# Patient Record
Sex: Male | Born: 1965 | Race: White | Hispanic: No | Marital: Single | Smoking: Current every day smoker
Health system: Southern US, Community
[De-identification: ages and names within clinical notes are randomized; demographics above are authoritative.]

## PROBLEM LIST (undated history)

## (undated) DIAGNOSIS — I10 Essential (primary) hypertension: Secondary | ICD-10-CM

## (undated) DIAGNOSIS — E785 Hyperlipidemia, unspecified: Secondary | ICD-10-CM

## (undated) DIAGNOSIS — J45909 Unspecified asthma, uncomplicated: Secondary | ICD-10-CM

## (undated) DIAGNOSIS — E079 Disorder of thyroid, unspecified: Secondary | ICD-10-CM

## (undated) DIAGNOSIS — E119 Type 2 diabetes mellitus without complications: Secondary | ICD-10-CM

## (undated) HISTORY — PX: CARPAL TUNNEL RELEASE: SHX101

## (undated) HISTORY — PX: OTHER SURGICAL HISTORY: SHX169

## (undated) HISTORY — DX: Hyperlipidemia, unspecified: E78.5

---

## 2019-02-21 ENCOUNTER — Emergency Department: Payer: PRIVATE HEALTH INSURANCE

## 2019-02-21 ENCOUNTER — Observation Stay
Admission: EM | Admit: 2019-02-21 | Discharge: 2019-02-23 | Disposition: A | Discharge status: Home / Self Care | Payer: PRIVATE HEALTH INSURANCE | Attending: Surgery | Admitting: Surgery

## 2019-02-21 ENCOUNTER — Encounter: Payer: Self-pay | Admitting: Emergency Medicine

## 2019-02-21 ENCOUNTER — Emergency Department: Payer: PRIVATE HEALTH INSURANCE | Admitting: Certified Registered"

## 2019-02-21 ENCOUNTER — Encounter
Admission: EM | Disposition: A | Discharge status: Home / Self Care | Payer: Self-pay | Source: Home / Self Care | Attending: Emergency Medicine

## 2019-02-21 ENCOUNTER — Other Ambulatory Visit: Payer: Self-pay

## 2019-02-21 DIAGNOSIS — K8020 Calculus of gallbladder without cholecystitis without obstruction: Secondary | ICD-10-CM | POA: Diagnosis present

## 2019-02-21 DIAGNOSIS — R1013 Epigastric pain: Secondary | ICD-10-CM

## 2019-02-21 DIAGNOSIS — E669 Obesity, unspecified: Secondary | ICD-10-CM | POA: Diagnosis not present

## 2019-02-21 DIAGNOSIS — Z6834 Body mass index (BMI) 34.0-34.9, adult: Secondary | ICD-10-CM | POA: Insufficient documentation

## 2019-02-21 DIAGNOSIS — K810 Acute cholecystitis: Secondary | ICD-10-CM | POA: Diagnosis present

## 2019-02-21 DIAGNOSIS — I4510 Unspecified right bundle-branch block: Secondary | ICD-10-CM | POA: Insufficient documentation

## 2019-02-21 DIAGNOSIS — E119 Type 2 diabetes mellitus without complications: Secondary | ICD-10-CM | POA: Diagnosis not present

## 2019-02-21 DIAGNOSIS — I10 Essential (primary) hypertension: Secondary | ICD-10-CM | POA: Insufficient documentation

## 2019-02-21 DIAGNOSIS — K812 Acute cholecystitis with chronic cholecystitis: Secondary | ICD-10-CM | POA: Diagnosis not present

## 2019-02-21 DIAGNOSIS — F17210 Nicotine dependence, cigarettes, uncomplicated: Secondary | ICD-10-CM | POA: Insufficient documentation

## 2019-02-21 DIAGNOSIS — R52 Pain, unspecified: Secondary | ICD-10-CM

## 2019-02-21 DIAGNOSIS — Z79899 Other long term (current) drug therapy: Secondary | ICD-10-CM | POA: Insufficient documentation

## 2019-02-21 DIAGNOSIS — Z791 Long term (current) use of non-steroidal anti-inflammatories (NSAID): Secondary | ICD-10-CM | POA: Diagnosis not present

## 2019-02-21 DIAGNOSIS — D72829 Elevated white blood cell count, unspecified: Secondary | ICD-10-CM | POA: Insufficient documentation

## 2019-02-21 DIAGNOSIS — K429 Umbilical hernia without obstruction or gangrene: Secondary | ICD-10-CM | POA: Diagnosis not present

## 2019-02-21 DIAGNOSIS — E079 Disorder of thyroid, unspecified: Secondary | ICD-10-CM | POA: Diagnosis not present

## 2019-02-21 DIAGNOSIS — R112 Nausea with vomiting, unspecified: Secondary | ICD-10-CM

## 2019-02-21 DIAGNOSIS — J45909 Unspecified asthma, uncomplicated: Secondary | ICD-10-CM | POA: Insufficient documentation

## 2019-02-21 HISTORY — DX: Essential (primary) hypertension: I10

## 2019-02-21 HISTORY — DX: Disorder of thyroid, unspecified: E07.9

## 2019-02-21 HISTORY — DX: Unspecified asthma, uncomplicated: J45.909

## 2019-02-21 HISTORY — PX: CHOLECYSTECTOMY: SHX55

## 2019-02-21 HISTORY — DX: Type 2 diabetes mellitus without complications: E11.9

## 2019-02-21 LAB — CBC
HCT: 40.5 % (ref 39.0–52.0)
HEMOGLOBIN: 13.7 g/dL (ref 13.0–17.0)
MCH: 29 pg (ref 26.0–34.0)
MCHC: 33.8 g/dL (ref 30.0–36.0)
MCV: 85.6 fL (ref 80.0–100.0)
PLATELETS: 327 10*3/uL (ref 150–400)
RBC: 4.73 MIL/uL (ref 4.22–5.81)
RDW: 11.7 % (ref 11.5–15.5)
WBC: 15.2 10*3/uL — ABNORMAL HIGH (ref 4.0–10.5)
nRBC: 0 % (ref 0.0–0.2)

## 2019-02-21 LAB — COMPREHENSIVE METABOLIC PANEL
ALBUMIN: 4.5 g/dL (ref 3.5–5.0)
ALT: 44 U/L (ref 0–44)
AST: 32 U/L (ref 15–41)
Alkaline Phosphatase: 48 U/L (ref 38–126)
Anion gap: 13 (ref 5–15)
BILIRUBIN TOTAL: 1.3 mg/dL — AB (ref 0.3–1.2)
BUN: 16 mg/dL (ref 6–20)
CO2: 19 mmol/L — ABNORMAL LOW (ref 22–32)
Calcium: 10.2 mg/dL (ref 8.9–10.3)
Chloride: 107 mmol/L (ref 98–111)
Creatinine, Ser: 0.87 mg/dL (ref 0.61–1.24)
GFR calc Af Amer: >60 mL/min (ref >60)
GFR calc non Af Amer: >60 mL/min (ref >60)
Glucose, Bld: 234 mg/dL — ABNORMAL HIGH (ref 70–99)
POTASSIUM: 3.5 mmol/L (ref 3.5–5.1)
Sodium: 139 mmol/L (ref 135–145)
Total Protein: 8 g/dL (ref 6.5–8.1)

## 2019-02-21 LAB — LIPASE, BLOOD: Lipase: 36 U/L (ref 11–51)

## 2019-02-21 LAB — GLUCOSE, CAPILLARY
Glucose-Capillary: 181 mg/dL — ABNORMAL HIGH (ref 70–99)
Glucose-Capillary: 118 mg/dL — ABNORMAL HIGH (ref 70–99)

## 2019-02-21 LAB — TROPONIN I: Troponin I: <0.03 ng/mL (ref <0.03)

## 2019-02-21 SURGERY — LAPAROSCOPIC CHOLECYSTECTOMY
Anesthesia: General

## 2019-02-21 MED ORDER — BUPIVACAINE HCL (PF) 0.5 % IJ SOLN
INTRAMUSCULAR | Status: AC
  Filled 2019-02-21: qty 30

## 2019-02-21 MED ORDER — ACETAMINOPHEN 325 MG PO TABS: 650.0000 mg | ORAL_TABLET | Freq: Four times a day (QID) | ORAL | Status: DC | PRN

## 2019-02-21 MED ORDER — METOCLOPRAMIDE HCL 5 MG/ML IJ SOLN
5.0000 mg | Freq: Once | INTRAMUSCULAR | Status: AC
  Administered 2019-02-21: 5 mg via INTRAVENOUS
  Filled 2019-02-21: qty 2

## 2019-02-21 MED ORDER — HYDROMORPHONE HCL 1 MG/ML IJ SOLN
0.2500 mg | INTRAMUSCULAR | Status: AC | PRN
  Administered 2019-02-21 (×8): 0.25 mg via INTRAVENOUS

## 2019-02-21 MED ORDER — KETOROLAC TROMETHAMINE 30 MG/ML IJ SOLN
INTRAMUSCULAR | Status: AC
  Filled 2019-02-21: qty 1

## 2019-02-21 MED ORDER — SUCCINYLCHOLINE CHLORIDE 200 MG/10ML IV SOSY
PREFILLED_SYRINGE | INTRAVENOUS | Status: DC | PRN
  Administered 2019-02-21: 120 mg via INTRAVENOUS

## 2019-02-21 MED ORDER — LIDOCAINE HCL (PF) 2 % IJ SOLN
INTRAMUSCULAR | Status: DC | PRN
  Administered 2019-02-21: 100 mg via INTRADERMAL

## 2019-02-21 MED ORDER — FAMOTIDINE IN NACL 20-0.9 MG/50ML-% IV SOLN
20.0000 mg | Freq: Once | INTRAVENOUS | Status: AC
  Administered 2019-02-21: 20 mg via INTRAVENOUS
  Filled 2019-02-21: qty 50

## 2019-02-21 MED ORDER — HYDROMORPHONE HCL 1 MG/ML IJ SOLN
0.5000 mg | Freq: Once | INTRAMUSCULAR | Status: AC
  Administered 2019-02-21: 0.5 mg via INTRAVENOUS
  Filled 2019-02-21: qty 1

## 2019-02-21 MED ORDER — MIDAZOLAM HCL 2 MG/2ML IJ SOLN
INTRAMUSCULAR | Status: AC
  Filled 2019-02-21: qty 2

## 2019-02-21 MED ORDER — FENTANYL CITRATE (PF) 100 MCG/2ML IJ SOLN
INTRAMUSCULAR | Status: DC | PRN
  Administered 2019-02-21 (×3): 25 ug via INTRAVENOUS
  Administered 2019-02-21: 100 ug via INTRAVENOUS
  Administered 2019-02-21 (×4): 50 ug via INTRAVENOUS
  Administered 2019-02-21: 25 ug via INTRAVENOUS

## 2019-02-21 MED ORDER — CEFAZOLIN SODIUM-DEXTROSE 2-4 GM/100ML-% IV SOLN
2.0000 g | Freq: Once | INTRAVENOUS | Status: AC
  Administered 2019-02-21: 2 g via INTRAVENOUS
  Filled 2019-02-21: qty 100

## 2019-02-21 MED ORDER — ACETAMINOPHEN 10 MG/ML IV SOLN
1000.0000 mg | Freq: Once | INTRAVENOUS | Status: AC
  Administered 2019-02-21: 1000 mg via INTRAVENOUS

## 2019-02-21 MED ORDER — HYDROCODONE-ACETAMINOPHEN 5-325 MG PO TABS
1.0000 | ORAL_TABLET | ORAL | Status: DC | PRN
  Administered 2019-02-21: 1 via ORAL
  Administered 2019-02-22 (×2): 2 via ORAL
  Administered 2019-02-22: 1 via ORAL
  Filled 2019-02-21: qty 1
  Filled 2019-02-21: qty 2
  Filled 2019-02-21: qty 1
  Filled 2019-02-21: qty 2

## 2019-02-21 MED ORDER — MORPHINE SULFATE (PF) 2 MG/ML IV SOLN: 1.0000 mg | INTRAVENOUS | Status: DC | PRN

## 2019-02-21 MED ORDER — TRAMADOL HCL 50 MG PO TABS: 50.0000 mg | ORAL_TABLET | Freq: Four times a day (QID) | ORAL | Status: DC | PRN

## 2019-02-21 MED ORDER — ONDANSETRON HCL 4 MG/2ML IJ SOLN
INTRAMUSCULAR | Status: DC | PRN
  Administered 2019-02-21: 4 mg via INTRAVENOUS

## 2019-02-21 MED ORDER — SUCCINYLCHOLINE CHLORIDE 20 MG/ML IJ SOLN
INTRAMUSCULAR | Status: AC
  Filled 2019-02-21: qty 1

## 2019-02-21 MED ORDER — SEVOFLURANE IN SOLN
RESPIRATORY_TRACT | Status: AC
  Filled 2019-02-21: qty 250

## 2019-02-21 MED ORDER — PHENYLEPHRINE HCL 10 MG/ML IJ SOLN
INTRAMUSCULAR | Status: AC
  Filled 2019-02-21: qty 1

## 2019-02-21 MED ORDER — GADOBUTROL 1 MMOL/ML IV SOLN
10.0000 mL | Freq: Once | INTRAVENOUS | Status: AC | PRN
  Administered 2019-02-21: 10 mL via INTRAVENOUS

## 2019-02-21 MED ORDER — LIDOCAINE HCL (PF) 2 % IJ SOLN
INTRAMUSCULAR | Status: AC
  Filled 2019-02-21: qty 10

## 2019-02-21 MED ORDER — GLYCOPYRROLATE 0.2 MG/ML IJ SOLN
INTRAMUSCULAR | Status: AC
  Filled 2019-02-21: qty 1

## 2019-02-21 MED ORDER — ROCURONIUM BROMIDE 100 MG/10ML IV SOLN
INTRAVENOUS | Status: DC | PRN
  Administered 2019-02-21: 10 mg via INTRAVENOUS
  Administered 2019-02-21: 50 mg via INTRAVENOUS
  Administered 2019-02-21 (×4): 5 mg via INTRAVENOUS

## 2019-02-21 MED ORDER — SUGAMMADEX SODIUM 200 MG/2ML IV SOLN
INTRAVENOUS | Status: DC | PRN
  Administered 2019-02-21: 200 mg via INTRAVENOUS

## 2019-02-21 MED ORDER — SODIUM CHLORIDE 0.9 % IV SOLN
2.0000 g | INTRAVENOUS | Status: DC
  Administered 2019-02-21: 2 g via INTRAVENOUS
  Filled 2019-02-21: qty 2

## 2019-02-21 MED ORDER — HYDROMORPHONE HCL 1 MG/ML IJ SOLN
INTRAMUSCULAR | Status: AC
  Filled 2019-02-21: qty 1

## 2019-02-21 MED ORDER — MIDAZOLAM HCL 5 MG/5ML IJ SOLN
INTRAMUSCULAR | Status: DC | PRN
  Administered 2019-02-21: 2 mg via INTRAVENOUS

## 2019-02-21 MED ORDER — ONDANSETRON HCL 4 MG/2ML IJ SOLN
INTRAMUSCULAR | Status: AC
  Filled 2019-02-21: qty 2

## 2019-02-21 MED ORDER — ROCURONIUM BROMIDE 50 MG/5ML IV SOLN
INTRAVENOUS | Status: AC
  Filled 2019-02-21: qty 2

## 2019-02-21 MED ORDER — SODIUM CHLORIDE 0.9 % IV BOLUS
1000.0000 mL | Freq: Once | INTRAVENOUS | Status: AC
  Administered 2019-02-21: 1000 mL via INTRAVENOUS

## 2019-02-21 MED ORDER — ONDANSETRON HCL 4 MG/2ML IJ SOLN: 4.0000 mg | Freq: Four times a day (QID) | INTRAMUSCULAR | Status: DC | PRN

## 2019-02-21 MED ORDER — LIDOCAINE-EPINEPHRINE (PF) 1 %-1:200000 IJ SOLN
INTRAMUSCULAR | Status: DC | PRN
  Administered 2019-02-21: 25 mL

## 2019-02-21 MED ORDER — ONDANSETRON HCL 4 MG/2ML IJ SOLN
4.0000 mg | Freq: Once | INTRAMUSCULAR | Status: AC | PRN
  Administered 2019-02-21: 4 mg via INTRAVENOUS
  Filled 2019-02-21: qty 2

## 2019-02-21 MED ORDER — ACETAMINOPHEN 650 MG RE SUPP: 650.0000 mg | Freq: Four times a day (QID) | RECTAL | Status: DC | PRN

## 2019-02-21 MED ORDER — LACTATED RINGERS IV SOLN
INTRAVENOUS | Status: DC
  Administered 2019-02-21: 12:00:00 via INTRAVENOUS

## 2019-02-21 MED ORDER — ONDANSETRON HCL 4 MG/2ML IJ SOLN
4.0000 mg | Freq: Once | INTRAMUSCULAR | Status: AC
  Administered 2019-02-21: 4 mg via INTRAVENOUS

## 2019-02-21 MED ORDER — PHENYLEPHRINE HCL 10 MG/ML IJ SOLN
INTRAMUSCULAR | Status: DC | PRN
  Administered 2019-02-21: 100 ug via INTRAVENOUS
  Administered 2019-02-21 (×2): 50 ug via INTRAVENOUS

## 2019-02-21 MED ORDER — CELECOXIB 200 MG PO CAPS
200.0000 mg | ORAL_CAPSULE | Freq: Two times a day (BID) | ORAL | Status: DC
  Administered 2019-02-21 – 2019-02-23 (×5): 200 mg via ORAL
  Filled 2019-02-21 (×7): qty 1

## 2019-02-21 MED ORDER — LACTATED RINGERS IV SOLN
INTRAVENOUS | Status: DC
  Administered 2019-02-21 – 2019-02-23 (×6): via INTRAVENOUS

## 2019-02-21 MED ORDER — ACETAMINOPHEN 10 MG/ML IV SOLN
INTRAVENOUS | Status: AC
  Filled 2019-02-21: qty 100

## 2019-02-21 MED ORDER — LIDOCAINE-EPINEPHRINE (PF) 1 %-1:200000 IJ SOLN
INTRAMUSCULAR | Status: AC
  Filled 2019-02-21: qty 30

## 2019-02-21 MED ORDER — ALBUTEROL SULFATE HFA 108 (90 BASE) MCG/ACT IN AERS
INHALATION_SPRAY | RESPIRATORY_TRACT | Status: DC | PRN
  Administered 2019-02-21: 2 via RESPIRATORY_TRACT

## 2019-02-21 MED ORDER — FENTANYL CITRATE (PF) 250 MCG/5ML IJ SOLN
INTRAMUSCULAR | Status: AC
  Filled 2019-02-21: qty 5

## 2019-02-21 MED ORDER — SODIUM CHLORIDE 0.9% FLUSH: 3.0000 mL | Freq: Once | INTRAVENOUS | Status: DC

## 2019-02-21 MED ORDER — PROPOFOL 10 MG/ML IV BOLUS
INTRAVENOUS | Status: DC | PRN
  Administered 2019-02-21: 200 mg via INTRAVENOUS
  Administered 2019-02-21: 10 mg via INTRAVENOUS
  Administered 2019-02-21: 5 mg via INTRAVENOUS
  Administered 2019-02-21 (×2): 10 mg via INTRAVENOUS

## 2019-02-21 MED ORDER — SUGAMMADEX SODIUM 200 MG/2ML IV SOLN
INTRAVENOUS | Status: AC
  Filled 2019-02-21: qty 2

## 2019-02-21 MED ORDER — PROPOFOL 10 MG/ML IV BOLUS
INTRAVENOUS | Status: AC
  Filled 2019-02-21: qty 40

## 2019-02-21 MED ORDER — ONDANSETRON 4 MG PO TBDP: 4.0000 mg | ORAL_TABLET | Freq: Four times a day (QID) | ORAL | Status: DC | PRN

## 2019-02-21 MED ORDER — FENTANYL CITRATE (PF) 100 MCG/2ML IJ SOLN
50.0000 ug | Freq: Once | INTRAMUSCULAR | Status: AC
  Administered 2019-02-21: 50 ug via INTRAVENOUS
  Filled 2019-02-21: qty 2

## 2019-02-21 MED ORDER — LACTATED RINGERS IV SOLN
INTRAVENOUS | Status: DC | PRN
  Administered 2019-02-21 (×2): via INTRAVENOUS

## 2019-02-21 MED ORDER — KETOROLAC TROMETHAMINE 30 MG/ML IJ SOLN
30.0000 mg | Freq: Once | INTRAMUSCULAR | Status: AC
  Administered 2019-02-21: 30 mg via INTRAVENOUS

## 2019-02-21 SURGICAL SUPPLY — 61 items
ANCHOR TIS RET SYS 235ML (MISCELLANEOUS) ×3 IMPLANT
APPLIER CLIP 5 13 M/L LIGAMAX5 (MISCELLANEOUS) ×3
BLADE SURG SZ11 CARB STEEL (BLADE) ×3 IMPLANT
BULB RESERV EVAC DRAIN JP 100C (MISCELLANEOUS) ×3 IMPLANT
CANISTER SUCT 1200ML W/VALVE (MISCELLANEOUS) ×3 IMPLANT
CHLORAPREP W/TINT 26 (MISCELLANEOUS) ×3 IMPLANT
CHOLANGIOGRAM CATH TAUT (CATHETERS) IMPLANT
CLIP APPLIE 5 13 M/L LIGAMAX5 (MISCELLANEOUS) ×1 IMPLANT
COVER WAND RF STERILE (DRAPES) ×3 IMPLANT
CUTTER FLEX LINEAR 45M (STAPLE) ×3 IMPLANT
DECANTER SPIKE VIAL GLASS SM (MISCELLANEOUS) ×6 IMPLANT
DEFOGGER SCOPE WARMER CLEARIFY (MISCELLANEOUS) ×3 IMPLANT
DERMABOND ADVANCED (GAUZE/BANDAGES/DRESSINGS) ×2
DERMABOND ADVANCED .7 DNX12 (GAUZE/BANDAGES/DRESSINGS) ×1 IMPLANT
DISSECTOR BLUNT TIP ENDO 5MM (MISCELLANEOUS) IMPLANT
DISSECTOR KITTNER STICK (MISCELLANEOUS) IMPLANT
DISSECTORS/KITTNER STICK (MISCELLANEOUS)
DRAIN CHANNEL JP 15F RND 16 (MISCELLANEOUS) ×3 IMPLANT
DRAPE C-ARM XRAY 36X54 (DRAPES) IMPLANT
DRAPE SHEET LG 3/4 BI-LAMINATE (DRAPES) IMPLANT
ELECT CAUTERY BLADE 6.4 (BLADE) ×3 IMPLANT
ELECT REM PT RETURN 9FT ADLT (ELECTROSURGICAL) ×3
ELECTRODE REM PT RTRN 9FT ADLT (ELECTROSURGICAL) ×1 IMPLANT
GLOVE BIOGEL PI IND STRL 7.0 (GLOVE) ×1 IMPLANT
GLOVE BIOGEL PI INDICATOR 7.0 (GLOVE) ×2
GLOVE SURG SYN 7.0 (GLOVE) ×3 IMPLANT
GOWN STRL REUS W/ TWL LRG LVL3 (GOWN DISPOSABLE) ×2 IMPLANT
GOWN STRL REUS W/TWL LRG LVL3 (GOWN DISPOSABLE) ×4
GRASPER SUT TROCAR 14GX15 (MISCELLANEOUS) IMPLANT
HEMOSTAT ARISTA ABSORB 3G PWDR (HEMOSTASIS) ×3 IMPLANT
HEMOSTAT SURGICEL 2X3 (HEMOSTASIS) ×3 IMPLANT
IRRIGATION STRYKERFLOW (MISCELLANEOUS) IMPLANT
IRRIGATOR STRYKERFLOW (MISCELLANEOUS)
IV CATH ANGIO 12GX3 LT BLUE (NEEDLE) IMPLANT
IV NS 1000ML (IV SOLUTION) ×2
IV NS 1000ML BAXH (IV SOLUTION) ×1 IMPLANT
JACKSON PRATT 10 (INSTRUMENTS) IMPLANT
L-HOOK LAP DISP 36CM (ELECTROSURGICAL) ×3
LHOOK LAP DISP 36CM (ELECTROSURGICAL) ×1 IMPLANT
NEEDLE HYPO 22GX1.5 SAFETY (NEEDLE) ×3 IMPLANT
PACK LAP CHOLECYSTECTOMY (MISCELLANEOUS) ×3 IMPLANT
PENCIL ELECTRO HAND CTR (MISCELLANEOUS) ×3 IMPLANT
PORT ACCESS TROCAR AIRSEAL 5 (TROCAR) IMPLANT
RELOAD STAPLE TA45 3.5 REG BLU (ENDOMECHANICALS) ×3 IMPLANT
SCISSORS METZENBAUM CVD 33 (INSTRUMENTS) ×3 IMPLANT
SET TRI-LUMEN FLTR TB AIRSEAL (TUBING) IMPLANT
SET TUBE SMOKE EVAC HIGH FLOW (TUBING) ×3 IMPLANT
SLEEVE ENDOPATH XCEL 5M (ENDOMECHANICALS) ×3 IMPLANT
SPONGE LAP 18X18 RF (DISPOSABLE) IMPLANT
STOPCOCK 4 WAY LG BORE MALE ST (IV SETS) IMPLANT
SUT MNCRL 4-0 (SUTURE) ×2
SUT MNCRL 4-0 27XMFL (SUTURE) ×1
SUT VIC AB 3-0 SH 27 (SUTURE)
SUT VIC AB 3-0 SH 27X BRD (SUTURE) IMPLANT
SUT VICRYL 0 AB UR-6 (SUTURE) ×6 IMPLANT
SUTURE MNCRL 4-0 27XMF (SUTURE) ×1 IMPLANT
SYR 20CC LL (SYRINGE) ×3 IMPLANT
TOWEL OR 17X26 4PK STRL BLUE (TOWEL DISPOSABLE) ×3 IMPLANT
TROCAR XCEL BLUNT TIP 100MML (ENDOMECHANICALS) ×3 IMPLANT
TROCAR XCEL NON-BLD 5MMX100MML (ENDOMECHANICALS) ×3 IMPLANT
WATER STERILE IRR 1000ML POUR (IV SOLUTION) ×3 IMPLANT

## 2019-02-21 NOTE — Op Note (Signed)
Preoperative diagnosis:  biliary colic  Postoperative diagnosis: acute cholecystitis, umbilical hernia, reducible  Procedure: Laparoscopic Cholecystectomy.   Anesthesia: GETA   Surgeon: Benjamine Sprague  Specimen: Gallbladder  Complications: None  EBL: 77m  Wound Classification: Clean Contaminated  Indications: see HPI  Findings: Critical view of safety noted Cystic duct and artery identified.  Stapled across duct, artery ligated and divided, clips remained intact at end of procedure Adequate hemostasis  Description of procedure: The patient was placed on the operating table in the supine position. SCDs placed, pre-op abx administered.  General anesthesia was induced and OG tube placed by anesthesia. A time-out was completed verifying correct patient, procedure, site, positioning, and implant(s) and/or special equipment prior to beginning this procedure. The abdomen was prepped and draped in the usual sterile fashion.  An incision was made in a natural skin line under the umbilicus.  Dissection carried down to palpable umbilical hernia where two 0 vicryl sutures placed to use as anchor sutures for hasson port.  That was inserted through defect and insufflation started up to 143mHg without any dramatic increase in pressure.    The laparoscope was inserted and the abdomen inspected. No injuries from initial trocar placement were noted. Additional trocars were then inserted under direct visualization in the following locations: a 5-mm trocar in the subxyphoid region and two 5-mm trocars along the right costal margin. The abdomen was inspected and no abnormalities or injuries were found. The table was placed in the reverse Trendelenburg position with the right side up.   Very dense, large, thickended gallblader with thick adhesions.   between the gallbladder and omentum, duodenum and transverse colon.  These were taken down with blunt dissection and electrocautery.  The dome of the gallbladder  was unable to be grasped with an atraumatic grasper due to the thickened wall and tense gallbladder.  Needle was placed within the gallbladder and all the bile suctioned out.  This allowed the gallbladder fundus to be grabbed and retracted to the superior portion of the operative field.  Decompression of the gallbladder had to be repeated near the infundibulum as well in order to the grasp with an atraumatic grasper and retracted toward the right lower quadrant. This maneuver exposed the supposed area of Calot's triangle.  Meticulous dissection in this area was attempted, but persistently thick and inflamed tissue made the dissection tenuous.    Attempt was then made to proceed with a top-down approach to better able to visualize the critical structures, but this was met with moderate amounts of bleeding from the gallbladder as well as the gallbladder fossa.  The top-down approach was abandoned about halfway through the gallbladder itself, prior to returning to further dissection of close triangle.  The additional freedom of movement secondary to the top-down approach dissection allowed enough tension in the tissue area to continue safely with dissection in the critical area to visualize critical view of safety with the liver bed clearly visible behind the duct and artery with no additional structures noted.  The cystic duct was noted to be extremely short, without any length to place the typical clips.  Decision was made at this point to use a stapler to transect part of the infundibulum in order to remove the rest of gallbladder.  The subxiphoid 5 mm port was upgraded to a 12 mm port secondary to the stapler not reaching the area through the umbilical port.  The stapler was then placed across the infundibulum, with the posterior side clearly visible prior  to clamping and stapling across.  Adequate hemostasis was noted at the staple line and no evidence of bile leak was noted.  The cystic artery was then clipped  and ligated by placing 2 proximal clips and one distal clip.  The gallbladder was then dissected from its peritoneal and liver bed attachments by electrocautery.  Again dense adhesions to the gallbladder fossa was noted, but the gallbladder eventually was able to be removed completely.  Middle portion of the liver were noted to have some persistent bleeding that was eventually controlled with a combination of manual pressure, Surgicel placement, and application of Arista powder.    Hemostasis was checked and confirmed one last time prior to the gallbladder removed using an endoscopic retrieval bag placed through the upsized xiphoid port.  Due to the very large gallbladder the incision had to be widened and the bag itself tore during the retrieval process..  The gallbladder was passed off the table as a specimen. The gallbladder fossa was copiously irrigated with saline and any leaked bile was suctioned out, and hemostasis was obtained. There was no evidence of bleeding from the gallbladder fossa or cystic artery or leakage of the bile from the cystic duct stump.  15 mm Blake drain laced through the middle port prior to subxiphoid port site closed with PMI using 0 vicryl under direct vision.  Abdomen desufflated and remaining trocars were removed under direct vision. No bleeding was noted.  Anchor sutures used to elevate the fascia at the umbilical port site, where 0 Vicryl was used in a figure-of-eight fashion to close the hernia's defect under minimal tension.  3-0 vicryl used to close deep dermal layer at umbilical site and subxiphoid site.  The subxiphoid site was copiously irrigated.  All skin incisions then closed with subcuticular sutures of 4-0 monocryl and dressed with topical skin adhesive. The orogastric tube was removed and patient extubated. The patient tolerated the procedure well and was taken to the postanesthesia care unit in stable condition.  All sponge and instrument count correct at end of  procedure.

## 2019-02-21 NOTE — Anesthesia Post-op Follow-up Note (Addendum)
Anesthesia QCDR form completed.        

## 2019-02-21 NOTE — ED Notes (Signed)
Pt transported to pacu by carolyn, orderly

## 2019-02-21 NOTE — H&P (Signed)
Subjective:   CC: biliary colic  HPI:  James Kent is a 53 y.o. male who is consulted by sung for evaluation of above cc.  Symptoms were first noted 2 months ago. Pain is sharp, intermittent, localized to epigastric region.  Previous two episodes resolved without any issues, but his episode did not get any better.  Associated with N/V, exacerbated by nothing specific.  Maybe related to fatty food intake.  Pain onset were at nightime.     Past Medical History:  has a past medical history of Asthma, Diabetes mellitus without complication (HCC), Hypertension, and Thyroid disease.  Past Surgical History:  has a past surgical history that includes Carpal tunnel release.  Family History: reviewed and not relevant to CC  Social History:  reports that he has been smoking cigarettes. He has been smoking about 0.50 packs per day. He has never used smokeless tobacco. He reports that he does not drink alcohol or use drugs.  Current Medications: none reported  Allergies:  Allergies as of 02/21/2019  . (No Known Allergies)    ROS:  General: Denies weight loss, weight gain, fatigue, fevers, chills, and night sweats. Eyes: Denies blurry vision, double vision, eye pain, itchy eyes, and tearing. Ears: Denies hearing loss, earache, and ringing in ears. Nose: Denies sinus pain, congestion, infections, runny nose, and nosebleeds. Mouth/throat: Denies hoarseness, sore throat, bleeding gums, and difficulty swallowing. Heart: Denies chest pain, palpitations, racing heart, irregular heartbeat, leg pain or swelling, and decreased activity tolerance. Respiratory: Denies breathing difficulty, shortness of breath, wheezing, cough, and sputum. GI: Denies change in appetite, heartburn,  and blood in stool. GU: Denies difficulty urinating, pain with urinating, urgency, frequency, blood in urine. Musculoskeletal: Denies joint stiffness, pain, swelling, muscle weakness. Skin: Denies rash, itching, mass, tumors,  sores, and boils Neurologic: Denies headache, fainting, dizziness, seizures, numbness, and tingling. Psychiatric: Denies depression, anxiety, difficulty sleeping, and memory loss. Endocrine: Denies heat or cold intolerance, and increased thirst or urination. Blood/lymph: Denies easy bruising, easy bruising, and swollen glands    Objective:     BP (!) 171/98   Pulse 85   Temp 97.7 F (36.5 C) (Oral)   Resp 14   Ht 5\' 7"  (1.702 m)   Wt 99.8 kg   SpO2 98%   BMI 34.46 kg/m    Constitutional :  alert, cooperative, appears stated age and no distress  Lymphatics/Throat:  no asymmetry, masses, or scars  Respiratory:  clear to auscultation bilaterally  Cardiovascular:  regular rate and rhythm  Gastrointestinal: soft, no guarding, minimal tenderness in epigastric region.   Musculoskeletal: Steady gait and movement  Skin: Cool and moist  Psychiatric: Normal affect, non-agitated, not confused       LABS:  CMP Latest Ref Rng & Units 02/21/2019  Glucose 70 - 99 mg/dL 425(Z)  BUN 6 - 20 mg/dL 16  Creatinine 5.63 - 8.75 mg/dL 6.43  Sodium 329 - 518 mmol/L 139  Potassium 3.5 - 5.1 mmol/L 3.5  Chloride 98 - 111 mmol/L 107  CO2 22 - 32 mmol/L 19(L)  Calcium 8.9 - 10.3 mg/dL 84.1  Total Protein 6.5 - 8.1 g/dL 8.0  Total Bilirubin 0.3 - 1.2 mg/dL 6.6(A)  Alkaline Phos 38 - 126 U/L 48  AST 15 - 41 U/L 32  ALT 0 - 44 U/L 44   CBC Latest Ref Rng & Units 02/21/2019  WBC 4.0 - 10.5 K/uL 15.2(H)  Hemoglobin 13.0 - 17.0 g/dL 63.0  Hematocrit 16.0 - 52.0 % 40.5  Platelets  150 - 400 K/uL 327     RADS: CLINICAL DATA:  Epigastric pain, nausea, and vomiting.  EXAM: ULTRASOUND ABDOMEN LIMITED RIGHT UPPER QUADRANT  COMPARISON:  None.  FINDINGS: Gallbladder:  Small amount of sludge layering in the gallbladder. No discrete stones identified. No gallbladder wall thickening or edema. Murphy's sign is positive.  Common bile duct:  Diameter: 7-8 mm, mildly  dilated.  Liver:  No focal lesion identified. Within normal limits in parenchymal echogenicity. Portal vein is patent on color Doppler imaging with normal direction of blood flow towards the liver.  IMPRESSION: Gallbladder sludge with positive Murphy's sign could indicate acute cholecystitis in the appropriate clinical setting although no stones are demonstrated. Mild bile duct dilatation. Cause is not identified. Correlation with liver function studies is recommended.   Electronically Signed   By: Burman Nieves M.D.   On: 02/21/2019 01:50  CLINICAL DATA:  Upper abdominal pain, vomiting, possible dilated CBD on ultrasound  EXAM: MRI ABDOMEN WITHOUT AND WITH CONTRAST (INCLUDING MRCP)  TECHNIQUE: Multiplanar multisequence MR imaging of the abdomen was performed both before and after the administration of intravenous contrast. Heavily T2-weighted images of the biliary and pancreatic ducts were obtained, and three-dimensional MRCP images were rendered by post processing.  CONTRAST:  10 mL Gadovist IV  COMPARISON:  Right upper quadrant ultrasound dated 02/21/2019  FINDINGS: Lower chest: Lung bases are clear.  Hepatobiliary: 6 mm cyst at the junction of segment 5 and 4B (series 5/image 18).  Tiny layering gallstones with mild gallbladder distension. No convincing pericholecystic inflammatory changes to suggest acute cholecystitis.  No intrahepatic or extrahepatic ductal dilatation. Common duct measures 8 mm, within the upper limits of normal. No choledocholithiasis is seen.  Pancreas:  Within normal limits.  Spleen:  Within normal limits.  Adrenals/Urinary Tract:  Adrenal glands are within normal limits.  Kidneys are within normal limits.  No hydronephrosis.  Stomach/Bowel: Stomach is within normal limits.  Visualized bowel is unremarkable.  Vascular/Lymphatic:  No evidence of abdominal aortic aneurysm.  No suspicious abdominal  lymphadenopathy.  Other:  No abdominal ascites.  Musculoskeletal: No focal osseous lesions.  IMPRESSION: Mild cholelithiasis with gallbladder distention. No associated findings to suggest acute cholecystitis.  No intrahepatic or extrahepatic ductal dilatation. Common duct measures 8 mm, at the upper limits of normal. No choledocholithiasis is seen.   Electronically Signed   By: Charline Bills M.D.   On: 02/21/2019 05:35 Assessment:      Epigastric pain, likely biliary colic, imaging shows sludge only but leukocytosis present  Plan:      Discussed the risk of surgery including post-op infxn, seroma, biloma, chronic pain, poor-delayed wound healing, retained gallstone, conversion to open procedure, post-op SBO or ileus, and need for additional procedures to address said risks.  The risks of general anesthetic including MI, CVA, sudden death or even reaction to anesthetic medications also discussed. Alternatives include continued observation.  Benefits include possible symptom relief, prevention of complications including acute cholecystitis, pancreatitis.  Typical post operative recovery of 3-5 days rest, continued pain in area and incision sites, possible loose stools up to 4-6 weeks, also discussed.  The patient understands the risks, any and all questions were answered to the patient's satisfaction.  We initially discussed possible other causes such as gastric ulcers as cause of pain, but patient will like to proceed with surgery to definitively rule out/in GB as etiology of this pain.  I made no guarantees since imaging did not show acute cholecystitis, but believe clinically suspicion is  high enough to warrant proceeding with lap chole

## 2019-02-21 NOTE — ED Triage Notes (Signed)
Pt arrives POV and ambulatory to triage with c/o upper abdominal pain for several days. Tonight pt reports vomiting along with pain. Pt states that he is also experiencing sweats with the pain. Pt is actively vomiting in triage at this time.

## 2019-02-21 NOTE — Transfer of Care (Signed)
Immediate Anesthesia Transfer of Care Note  Patient: James Kent  Procedure(s) Performed: LAPAROSCOPIC CHOLECYSTECTOMY (N/A )  Patient Location: PACU  Anesthesia Type:General  Level of Consciousness: awake, alert , oriented and patient cooperative  Airway & Oxygen Therapy: Patient Spontanous Breathing and Patient connected to face mask oxygen  Post-op Assessment: Report given to RN and Post -op Vital signs reviewed and stable  Post vital signs: Reviewed and stable  Last Vitals:  Vitals Value Taken Time  BP 181/114 02/21/2019 11:20 AM  Temp    Pulse 108 02/21/2019 11:21 AM  Resp 17 02/21/2019 11:21 AM  SpO2 100 % 02/21/2019 11:21 AM  Vitals shown include unvalidated device data.  Last Pain:  Vitals:   02/21/19 0659  TempSrc:   PainSc: 6          Complications: No apparent anesthesia complications

## 2019-02-21 NOTE — Anesthesia Preprocedure Evaluation (Addendum)
Anesthesia Evaluation  Patient identified by MRN, date of birth, ID band Patient awake    Reviewed: Allergy & Precautions, H&P , NPO status , Patient's Chart, lab work & pertinent test results  Airway Mallampati: III       Dental  (+) Chipped, Poor Dentition Large front teeth:   Pulmonary asthma , Current Smoker,           Cardiovascular hypertension, (-) angina(-) Past MI, (-) Cardiac Stents and (-) CABG (-) dysrhythmias      Neuro/Psych negative neurological ROS  negative psych ROS   GI/Hepatic negative GI ROS, (+)     substance abuse (reports no alchohol in 6 mos)  alcohol use,   Endo/Other  diabetes  Renal/GU negative Renal ROS     Musculoskeletal   Abdominal   Peds  Hematology negative hematology ROS (+)   Anesthesia Other Findings Obese Pt has been vomiting, last episode ~4 hrs ago  Past Medical History: No date: Asthma No date: Diabetes mellitus without complication (HCC) No date: Hypertension No date: Thyroid disease  Past Surgical History: No date: CARPAL TUNNEL RELEASE  BMI    Body Mass Index:  34.46 kg/m      Reproductive/Obstetrics negative OB ROS                           Anesthesia Physical Anesthesia Plan  ASA: III  Anesthesia Plan: General ETT   Post-op Pain Management:    Induction: Rapid sequence and Cricoid pressure planned  PONV Risk Score and Plan: Ondansetron, Dexamethasone, Midazolam and Treatment may vary due to age or medical condition  Airway Management Planned: Video Laryngoscope Planned  Additional Equipment:   Intra-op Plan:   Post-operative Plan:   Informed Consent: I have reviewed the patients History and Physical, chart, labs and discussed the procedure including the risks, benefits and alternatives for the proposed anesthesia with the patient or authorized representative who has indicated his/her understanding and acceptance.      Dental Advisory Given  Plan Discussed with: Anesthesiologist and CRNA  Anesthesia Plan Comments:       Anesthesia Quick Evaluation

## 2019-02-21 NOTE — ED Notes (Signed)
Admitting Provider at bedside. 

## 2019-02-21 NOTE — ED Notes (Signed)
Patient transported to MRI 

## 2019-02-21 NOTE — Anesthesia Procedure Notes (Signed)
Procedure Name: Intubation Date/Time: 02/21/2019 8:14 AM Performed by: Adalberto Ill, CRNA Pre-anesthesia Checklist: Patient identified, Emergency Drugs available, Suction available, Patient being monitored and Timeout performed Patient Re-evaluated:Patient Re-evaluated prior to induction Oxygen Delivery Method: Circle system utilized Preoxygenation: Pre-oxygenation with 100% oxygen Induction Type: IV induction, Cricoid Pressure applied and Rapid sequence Laryngoscope Size: Mac and 3 Grade View: Grade I Tube type: Oral Tube size: 7.5 mm Number of attempts: 1 (brief atraumatic ) Airway Equipment and Method: Stylet and Video-laryngoscopy Placement Confirmation: ETT inserted through vocal cords under direct vision,  positive ETCO2 and breath sounds checked- equal and bilateral Secured at: 23 cm Tube secured with: Tape Dental Injury: Teeth and Oropharynx as per pre-operative assessment

## 2019-02-21 NOTE — ED Notes (Signed)
Called pharmacy to request medication 

## 2019-02-21 NOTE — ED Provider Notes (Signed)
[facility] Emergency Department Provider Note   ____________________________________________   First MD Initiated Contact with Patient 02/21/19 [phone]     (approximate)  I have reviewed the triage vital signs and the nursing notes.   HISTORY  Chief Complaint Abdominal Pain    HPI James Kent is a 53 y.o. male who presents to the ED from home with a chief complaint of epigastric pain.  Patient ate tacos last evening and approximately 9 PM began to have severe epigastric pain associated with nausea and vomiting.  States he has had similar pains previously which had subsided without intervention.  Denies associated fever, chills, chest pain, shortness of breath, dysuria, diarrhea.  Denies recent travel or trauma.       Past Medical History:  Diagnosis Date   Asthma    Diabetes mellitus without complication (HCC)    Hypertension    Thyroid disease     There are no active problems to display for this patient.   Past Surgical History:  Procedure Laterality Date   CARPAL TUNNEL RELEASE      Prior to Admission medications   Not on File    Allergies Patient has no known allergies.  No family history on file.  Social History Social History   Tobacco Use   Smoking status: Current Every Day Smoker    Packs/day: 0.50    Types: Cigarettes   Smokeless tobacco: Never Used  Substance Use Topics   Alcohol use: Never    Frequency: Never   Drug use: Never    Review of Systems  Constitutional: No fever/chills Eyes: No visual changes. ENT: No sore throat. Cardiovascular: Denies chest pain. Respiratory: Denies shortness of breath. Gastrointestinal: Positive for abdominal pain, nausea and vomiting.  No diarrhea.  No constipation. Genitourinary: Negative for dysuria. Musculoskeletal: Negative for back pain. Skin: Negative for rash. Neurological: Negative for headaches, focal weakness or  numbness.   ____________________________________________   PHYSICAL EXAM:  VITAL SIGNS: ED Triage Vitals  Enc Vitals Group     BP 02/21/19 0051 (!) 171/88     Pulse Rate 02/21/19 0051 60     Resp 02/21/19 0051 (!) 22     Temp 02/21/19 0051 97.7 F (36.5 C)     Temp Source 02/21/19 0051 Oral     SpO2 02/21/19 0051 100 %     Weight 02/21/19 0058 220 lb (99.8 kg)     Height 02/21/19 0058  (1.702 m)     Head Circumference --      Peak Flow --      Pain Score 02/21/19 0057 10     Pain Loc --      Pain Edu? --      Excl. in GC? --     Constitutional: Alert and oriented. Well appearing and in no acute distress. Eyes: Conjunctivae are normal. PERRL. EOMI. Head: Atraumatic. Nose: No congestion/rhinnorhea. Mouth/Throat: Mucous membranes are moist.  Oropharynx non-erythematous. Neck: No stridor.   Cardiovascular: Normal rate, regular rhythm. Grossly normal heart sounds.  Good peripheral circulation. Respiratory: Normal respiratory effort.  No retractions. Lungs CTAB. Gastrointestinal: Soft and moderately tender to palpation epigastrium without rebound or guarding. No distention. No abdominal bruits. No CVA tenderness. Musculoskeletal: No lower extremity tenderness nor edema.  No joint effusions. Neurologic:  Normal speech and language. No gross focal neurologic deficits are appreciated. No gait instability. Skin:  Skin is warm, dry and intact. No rash noted. Psychiatric: Mood and affect are normal. Speech and behavior  are normal.  ____________________________________________   LABS (all labs ordered are listed, but only abnormal results are displayed)  Labs Reviewed  COMPREHENSIVE METABOLIC PANEL - Abnormal; Notable for the following components:      Result Value   CO2 19 (*)    Glucose, Bld 234 (*)    Total Bilirubin 1.3 (*)    All other components within normal limits  CBC - Abnormal; Notable for the following components:   WBC 15.2 (*)    All other components  within normal limits  LIPASE, BLOOD  TROPONIN I  URINALYSIS, COMPLETE (UACMP) WITH MICROSCOPIC   ____________________________________________  EKG  ED ECG REPORT I, Geronimo Diliberto J, the attending physician, personally viewed and interpreted this ECG.   Date: 02/21/2019  EKG Time: 0056  Rate: 69  Rhythm: normal EKG, normal sinus rhythm  Axis: Normal  Intervals:right bundle branch block  ST&T Change: Nonspecific  ____________________________________________  RADIOLOGY  ED MD interpretation: Ultrasound demonstrates gallbladder sludge,?  CBD dilation; MRCP demonstrates tiny layering gallstones with mild gallbladder distention, no evidence for choledocholithiasis  Official radiology report(s): Mr 3d Recon At Scanner  Result Date: 02/21/2019 CLINICAL DATA:  Upper abdominal pain, vomiting, possible dilated CBD on ultrasound EXAM: MRI ABDOMEN WITHOUT AND WITH CONTRAST (INCLUDING MRCP) TECHNIQUE: Multiplanar multisequence MR imaging of the abdomen was performed both before and after the administration of intravenous contrast. Heavily T2-weighted images of the biliary and pancreatic ducts were obtained, and three-dimensional MRCP images were rendered by post processing. CONTRAST:  10 mL Gadovist IV COMPARISON:  Right upper quadrant ultrasound dated 02/21/2019 FINDINGS: Lower chest: Lung bases are clear. Hepatobiliary: 6 mm cyst at the junction of segment 5 and 4B (series 5/image 18). Tiny layering gallstones with mild gallbladder distension. No convincing pericholecystic inflammatory changes to suggest acute cholecystitis. No intrahepatic or extrahepatic ductal dilatation. Common duct measures 8 mm, within the upper limits of normal. No choledocholithiasis is seen. Pancreas:  Within normal limits. Spleen:  Within normal limits. Adrenals/Urinary Tract:  Adrenal glands are within normal limits. Kidneys are within normal limits.  No hydronephrosis. Stomach/Bowel: Stomach is within normal limits.  Visualized bowel is unremarkable. Vascular/Lymphatic:  No evidence of abdominal aortic aneurysm. No suspicious abdominal lymphadenopathy. Other:  No abdominal ascites. Musculoskeletal: No focal osseous lesions. IMPRESSION: Mild cholelithiasis with gallbladder distention. No associated findings to suggest acute cholecystitis. No intrahepatic or extrahepatic ductal dilatation. Common duct measures 8 mm, at the upper limits of normal. No choledocholithiasis is seen. Electronically Signed   By: Charline Bills M.D.   On: 02/21/2019 05:35   Mr Abdomen Mrcp Vivien Rossetti Contast  Result Date: 02/21/2019 CLINICAL DATA:  Upper abdominal pain, vomiting, possible dilated CBD on ultrasound EXAM: MRI ABDOMEN WITHOUT AND WITH CONTRAST (INCLUDING MRCP) TECHNIQUE: Multiplanar multisequence MR imaging of the abdomen was performed both before and after the administration of intravenous contrast. Heavily T2-weighted images of the biliary and pancreatic ducts were obtained, and three-dimensional MRCP images were rendered by post processing. CONTRAST:  10 mL Gadovist IV COMPARISON:  Right upper quadrant ultrasound dated 02/21/2019 FINDINGS: Lower chest: Lung bases are clear. Hepatobiliary: 6 mm cyst at the junction of segment 5 and 4B (series 5/image 18). Tiny layering gallstones with mild gallbladder distension. No convincing pericholecystic inflammatory changes to suggest acute cholecystitis. No intrahepatic or extrahepatic ductal dilatation. Common duct measures 8 mm, within the upper limits of normal. No choledocholithiasis is seen. Pancreas:  Within normal limits. Spleen:  Within normal limits. Adrenals/Urinary Tract:  Adrenal glands are within normal limits.  Kidneys are within normal limits.  No hydronephrosis. Stomach/Bowel: Stomach is within normal limits. Visualized bowel is unremarkable. Vascular/Lymphatic:  No evidence of abdominal aortic aneurysm. No suspicious abdominal lymphadenopathy. Other:  No abdominal ascites.  Musculoskeletal: No focal osseous lesions. IMPRESSION: Mild cholelithiasis with gallbladder distention. No associated findings to suggest acute cholecystitis. No intrahepatic or extrahepatic ductal dilatation. Common duct measures 8 mm, at the upper limits of normal. No choledocholithiasis is seen. Electronically Signed   By: Charline Bills M.D.   On: 02/21/2019 05:35   US Abdomen Limited Ruq  Result Date: 02/21/2019 CLINICAL DATA:  Epigastric pain, nausea, and vomiting. EXAM: ULTRASOUND ABDOMEN LIMITED RIGHT UPPER QUADRANT COMPARISON:  None. FINDINGS: Gallbladder: Small amount of sludge layering in the gallbladder. No discrete stones identified. No gallbladder wall thickening or edema. Murphy's sign is positive. Common bile duct: Diameter: 7-8 mm, mildly dilated. Liver: No focal lesion identified. Within normal limits in parenchymal echogenicity. Portal vein is patent on color Doppler imaging with normal direction of blood flow towards the liver. IMPRESSION: Gallbladder sludge with positive Murphy's sign could indicate acute cholecystitis in the appropriate clinical setting although no stones are demonstrated. Mild bile duct dilatation. Cause is not identified. Correlation with liver function studies is recommended. Electronically Signed   By: Burman Nieves M.D.   On: 02/21/2019 01:50    ____________________________________________   PROCEDURES  Procedure(s) performed (including Critical Care):  Procedures  CRITICAL CARE Performed by: Irean Hong   Total critical care time: 45 minutes  Critical care time was exclusive of separately billable procedures and treating other patients.  Critical care was necessary to treat or prevent imminent or life-threatening deterioration.  Critical care was time spent personally by me on the following activities: development of treatment plan with patient and/or surrogate as well as nursing, discussions with consultants, evaluation of patient's  response to treatment, examination of patient, obtaining history from patient or surrogate, ordering and performing treatments and interventions, ordering and review of laboratory studies, ordering and review of radiographic studies, pulse oximetry and re-evaluation of patient's condition. ____________________________________________   INITIAL IMPRESSION / ASSESSMENT AND PLAN / ED COURSE  As part of my medical decision making, I reviewed the following data within the electronic MEDICAL RECORD NUMBER Nursing notes reviewed and incorporated, Labs reviewed, EKG interpreted, Old chart reviewed, Radiograph reviewed, Discussed with admitting physician and Notes from prior ED visits        53 year old male who presents with epigastric pain, nausea and vomiting after eating tacos. Differential diagnosis includes, but is not limited to, biliary disease (biliary colic, acute cholecystitis, cholangitis, choledocholithiasis, etc), intrathoracic causes for epigastric abdominal pain including ACS, gastritis, duodenitis, pancreatitis, small bowel or large bowel obstruction, abdominal aortic aneurysm, hernia, and ulcer(s).  Laboratory results remarkable for moderate leukocytosis and mild elevation of total bilirubin.  Ultrasound demonstrates gallbladder sludge with questionable dilation of the CBD.  Will administer IV Dilaudid for pain, IV Zofran for continued vomiting and proceed with MRCP to evaluate choledocholithiasis.   Clinical Course as of Feb 21 607  Sat Feb 21, 2019  0532 Patient returned from MRI.  Pain is 7/10 and patient is nauseated again.  Will repeat 0.5 mg IV Dilaudid.  With 5 mg IV Reglan.   [JS]  0601 MRCP notable for tiny layering gallstones without evidence for choledocholithiasis.  Discussed with surgery on-call Dr. Tonna Boehringer who will evaluate patient in emergency department.   [JS]    Clinical Course User Index [JS] Irean Hong, MD  ____________________________________________   FINAL CLINICAL IMPRESSION(S) / ED DIAGNOSES  Final diagnoses:  Epigastric pain  Non-intractable vomiting with nausea, unspecified vomiting type  Calculus of gallbladder without cholecystitis without obstruction     ED Discharge Orders    None       Note:  This document was prepared using Dragon voice recognition software and may include unintentional dictation errors.   Irean Hong, MD 02/21/19 [phone]

## 2019-02-21 NOTE — Anesthesia Postprocedure Evaluation (Signed)
Anesthesia Post Note  Patient: James Kent  Procedure(s) Performed: LAPAROSCOPIC CHOLECYSTECTOMY (N/A )  Patient location during evaluation: PACU Anesthesia Type: General Level of consciousness: awake and alert Pain management: pain level controlled Vital Signs Assessment: post-procedure vital signs reviewed and stable Respiratory status: spontaneous breathing, nonlabored ventilation, respiratory function stable and patient connected to nasal cannula oxygen Cardiovascular status: blood pressure returned to baseline and stable Postop Assessment: no apparent nausea or vomiting Anesthetic complications: no     Last Vitals:  Vitals:   02/21/19 1230 02/21/19 1235  BP:  130/70  Pulse: (!) 113 (!) 111  Resp: 16 14  Temp:    SpO2: 96% 96%    Last Pain:  Vitals:   02/21/19 1235  TempSrc:   PainSc: Asleep                 Jovita Gamma

## 2019-02-22 ENCOUNTER — Encounter: Payer: Self-pay | Admitting: Surgery

## 2019-02-22 DIAGNOSIS — K812 Acute cholecystitis with chronic cholecystitis: Secondary | ICD-10-CM | POA: Diagnosis not present

## 2019-02-22 LAB — HEPATIC FUNCTION PANEL
ALK PHOS: 36 U/L — AB (ref 38–126)
ALT: 49 U/L — AB (ref 0–44)
AST: 36 U/L (ref 15–41)
Albumin: 3.2 g/dL — ABNORMAL LOW (ref 3.5–5.0)
Bilirubin, Direct: 0.4 mg/dL — ABNORMAL HIGH (ref 0.0–0.2)
Indirect Bilirubin: 1.3 mg/dL — ABNORMAL HIGH (ref 0.3–0.9)
Total Bilirubin: 1.7 mg/dL — ABNORMAL HIGH (ref 0.3–1.2)
Total Protein: 6.1 g/dL — ABNORMAL LOW (ref 6.5–8.1)

## 2019-02-22 LAB — BASIC METABOLIC PANEL
Anion gap: 8 (ref 5–15)
BUN: 11 mg/dL (ref 6–20)
CHLORIDE: 107 mmol/L (ref 98–111)
CO2: 24 mmol/L (ref 22–32)
Calcium: 8.3 mg/dL — ABNORMAL LOW (ref 8.9–10.3)
Creatinine, Ser: 0.68 mg/dL (ref 0.61–1.24)
GFR calc Af Amer: >60 mL/min (ref >60)
GFR calc non Af Amer: >60 mL/min (ref >60)
Glucose, Bld: 134 mg/dL — ABNORMAL HIGH (ref 70–99)
POTASSIUM: 3.3 mmol/L — AB (ref 3.5–5.1)
Sodium: 139 mmol/L (ref 135–145)

## 2019-02-22 LAB — CBC
HCT: 36.1 % — ABNORMAL LOW (ref 39.0–52.0)
Hemoglobin: 11.8 g/dL — ABNORMAL LOW (ref 13.0–17.0)
MCH: 29.3 pg (ref 26.0–34.0)
MCHC: 32.7 g/dL (ref 30.0–36.0)
MCV: 89.6 fL (ref 80.0–100.0)
Platelets: 222 10*3/uL (ref 150–400)
RBC: 4.03 MIL/uL — ABNORMAL LOW (ref 4.22–5.81)
RDW: 12.1 % (ref 11.5–15.5)
WBC: 12.2 10*3/uL — ABNORMAL HIGH (ref 4.0–10.5)
nRBC: 0 % (ref 0.0–0.2)

## 2019-02-23 DIAGNOSIS — K812 Acute cholecystitis with chronic cholecystitis: Secondary | ICD-10-CM | POA: Diagnosis not present

## 2019-02-23 LAB — CBC
HCT: 34.3 % — ABNORMAL LOW (ref 39.0–52.0)
Hemoglobin: 11.2 g/dL — ABNORMAL LOW (ref 13.0–17.0)
MCH: 29.2 pg (ref 26.0–34.0)
MCHC: 32.7 g/dL (ref 30.0–36.0)
MCV: 89.6 fL (ref 80.0–100.0)
Platelets: 217 10*3/uL (ref 150–400)
RBC: 3.83 MIL/uL — AB (ref 4.22–5.81)
RDW: 12 % (ref 11.5–15.5)
WBC: 10.5 10*3/uL (ref 4.0–10.5)
nRBC: 0 % (ref 0.0–0.2)

## 2019-02-23 LAB — BASIC METABOLIC PANEL
Anion gap: 5 (ref 5–15)
BUN: 7 mg/dL (ref 6–20)
CO2: 26 mmol/L (ref 22–32)
Calcium: 8.2 mg/dL — ABNORMAL LOW (ref 8.9–10.3)
Chloride: 109 mmol/L (ref 98–111)
Creatinine, Ser: 0.77 mg/dL (ref 0.61–1.24)
GFR calc non Af Amer: >60 mL/min (ref >60)
Glucose, Bld: 125 mg/dL — ABNORMAL HIGH (ref 70–99)
Potassium: 3.6 mmol/L (ref 3.5–5.1)
Sodium: 140 mmol/L (ref 135–145)

## 2019-02-23 LAB — HEPATIC FUNCTION PANEL
ALBUMIN: 2.9 g/dL — AB (ref 3.5–5.0)
ALT: 65 U/L — ABNORMAL HIGH (ref 0–44)
AST: 61 U/L — ABNORMAL HIGH (ref 15–41)
Alkaline Phosphatase: 58 U/L (ref 38–126)
Bilirubin, Direct: 0.4 mg/dL — ABNORMAL HIGH (ref 0.0–0.2)
Indirect Bilirubin: 1 mg/dL — ABNORMAL HIGH (ref 0.3–0.9)
Total Bilirubin: 1.4 mg/dL — ABNORMAL HIGH (ref 0.3–1.2)
Total Protein: 6 g/dL — ABNORMAL LOW (ref 6.5–8.1)

## 2019-02-23 MED ORDER — ACETAMINOPHEN 325 MG PO TABS: 650.0000 mg | ORAL_TABLET | Freq: Three times a day (TID) | ORAL | 0 refills | Status: DC | PRN

## 2019-02-23 MED ORDER — IBUPROFEN 800 MG PO TABS: 800.0000 mg | ORAL_TABLET | Freq: Three times a day (TID) | ORAL | 0 refills | Status: DC | PRN

## 2019-02-23 MED ORDER — AMOXICILLIN-POT CLAVULANATE 875-125 MG PO TABS: 1.0000 | ORAL_TABLET | Freq: Two times a day (BID) | ORAL | 0 refills | Status: DC

## 2019-02-23 MED ORDER — DOCUSATE SODIUM 100 MG PO CAPS: 100.0000 mg | ORAL_CAPSULE | Freq: Two times a day (BID) | ORAL | 0 refills | Status: DC | PRN

## 2019-02-23 MED ORDER — HYDROCODONE-ACETAMINOPHEN 5-325 MG PO TABS: 1.0000 | ORAL_TABLET | Freq: Four times a day (QID) | ORAL | 0 refills | Status: AC | PRN

## 2019-02-24 LAB — SURGICAL PATHOLOGY

## 2019-02-24 NOTE — Discharge Summary (Signed)
Physician Discharge Summary  Patient ID: James Kent MRN: [id number] DOB/AGE: [birth year] 53 y.o.  Admit date: 02/21/2019 Discharge date: 02/24/2019  Admission Diagnoses: acute cholecystitis  Discharge Diagnoses:  Same as above  Discharged Condition: good  Hospital Course: dx with above.  Underwent lap chole with drain placement due to extensive inflammation in the area despite US findings.  Recovered well postop  Consults: None  Discharge Exam: Blood pressure 134/83, pulse 86, temperature 99 F (37.2 C), temperature source Oral, resp. rate 18, height 5\' 7"  (1.702 m), weight 99.8 kg, SpO2 94 %. General appearance: alert, cooperative and no distress GI: soft, non-tender; bowel sounds normal; no masses,  no organomegaly Incision/Wound: c/d/i. JP with serosanguinous drainage  Disposition:  Discharge disposition: 01-Home or Self Care       Discharge Instructions    Discharge patient   Complete by:  As directed    Discharge disposition:  01-Home or Self Care   Discharge patient date:  02/23/2019     Allergies as of 02/23/2019   No Known Allergies     Medication List    TAKE these medications   acetaminophen 325 MG tablet Commonly known as:  Tylenol Take 2 tablets (650 mg total) by mouth every 8 (eight) hours as needed for up to 30 days for mild pain.   amoxicillin-clavulanate 875-125 MG tablet Commonly known as:  Augmentin Take 1 tablet by mouth 2 (two) times daily for 7 days.   docusate sodium 100 MG capsule Commonly known as:  Colace Take 1 capsule (100 mg total) by mouth 2 (two) times daily as needed for up to 10 days for mild constipation.   HYDROcodone-acetaminophen 5-325 MG tablet Commonly known as:  Norco Take 1 tablet by mouth every 6 (six) hours as needed for up to 3 days for moderate pain.   ibuprofen 800 MG tablet Commonly known as:  ADVIL,MOTRIN Take 1 tablet (800 mg total) by mouth every 8 (eight) hours as needed for mild pain or moderate pain.       Follow-up Information    Sung Amabile, DO On 02/26/2019.   Specialty:  Surgery Why:  @2 :30pm Contact information: [address] [place] [address] [phone]            Total time spent arranging discharge was >40min. Signed: Sung Amabile 02/24/2019, 11:35 AM

## 2019-03-01 ENCOUNTER — Emergency Department: Payer: PRIVATE HEALTH INSURANCE

## 2019-03-01 ENCOUNTER — Inpatient Hospital Stay
Admission: EM | Admit: 2019-03-01 | Discharge: 2019-03-03 | DRG: 862 | Disposition: A | Discharge status: Home / Self Care | Payer: PRIVATE HEALTH INSURANCE | Attending: Surgery | Admitting: Surgery

## 2019-03-01 ENCOUNTER — Other Ambulatory Visit: Payer: Self-pay

## 2019-03-01 ENCOUNTER — Encounter: Payer: Self-pay | Admitting: Emergency Medicine

## 2019-03-01 DIAGNOSIS — F17210 Nicotine dependence, cigarettes, uncomplicated: Secondary | ICD-10-CM | POA: Diagnosis present

## 2019-03-01 DIAGNOSIS — T8143XA Infection following a procedure, organ and space surgical site, initial encounter: Secondary | ICD-10-CM | POA: Diagnosis present

## 2019-03-01 DIAGNOSIS — K838 Other specified diseases of biliary tract: Secondary | ICD-10-CM

## 2019-03-01 DIAGNOSIS — Y838 Other surgical procedures as the cause of abnormal reaction of the patient, or of later complication, without mention of misadventure at the time of the procedure: Secondary | ICD-10-CM | POA: Diagnosis present

## 2019-03-01 DIAGNOSIS — K839 Disease of biliary tract, unspecified: Secondary | ICD-10-CM | POA: Diagnosis not present

## 2019-03-01 DIAGNOSIS — K9186 Retained cholelithiasis following cholecystectomy: Secondary | ICD-10-CM | POA: Diagnosis present

## 2019-03-01 DIAGNOSIS — J45909 Unspecified asthma, uncomplicated: Secondary | ICD-10-CM | POA: Diagnosis present

## 2019-03-01 DIAGNOSIS — E119 Type 2 diabetes mellitus without complications: Secondary | ICD-10-CM | POA: Diagnosis present

## 2019-03-01 DIAGNOSIS — Z7984 Long term (current) use of oral hypoglycemic drugs: Secondary | ICD-10-CM

## 2019-03-01 DIAGNOSIS — I10 Essential (primary) hypertension: Secondary | ICD-10-CM | POA: Diagnosis present

## 2019-03-01 DIAGNOSIS — Y733 Surgical instruments, materials and gastroenterology and urology devices (including sutures) associated with adverse incidents: Secondary | ICD-10-CM | POA: Diagnosis present

## 2019-03-01 DIAGNOSIS — K9189 Other postprocedural complications and disorders of digestive system: Secondary | ICD-10-CM | POA: Diagnosis present

## 2019-03-01 DIAGNOSIS — K651 Peritoneal abscess: Secondary | ICD-10-CM | POA: Diagnosis present

## 2019-03-01 DIAGNOSIS — Z7951 Long term (current) use of inhaled steroids: Secondary | ICD-10-CM | POA: Diagnosis not present

## 2019-03-01 DIAGNOSIS — K8050 Calculus of bile duct without cholangitis or cholecystitis without obstruction: Secondary | ICD-10-CM | POA: Diagnosis not present

## 2019-03-01 DIAGNOSIS — R109 Unspecified abdominal pain: Secondary | ICD-10-CM

## 2019-03-01 DIAGNOSIS — L0291 Cutaneous abscess, unspecified: Secondary | ICD-10-CM

## 2019-03-01 DIAGNOSIS — R7401 Elevation of levels of liver transaminase levels: Secondary | ICD-10-CM

## 2019-03-01 DIAGNOSIS — R740 Nonspecific elevation of levels of transaminase and lactic acid dehydrogenase [LDH]: Secondary | ICD-10-CM

## 2019-03-01 DIAGNOSIS — Z79890 Hormone replacement therapy (postmenopausal): Secondary | ICD-10-CM | POA: Diagnosis not present

## 2019-03-01 DIAGNOSIS — Z9049 Acquired absence of other specified parts of digestive tract: Secondary | ICD-10-CM

## 2019-03-01 DIAGNOSIS — R17 Unspecified jaundice: Secondary | ICD-10-CM

## 2019-03-01 DIAGNOSIS — R1011 Right upper quadrant pain: Secondary | ICD-10-CM | POA: Diagnosis present

## 2019-03-01 LAB — CBC WITH DIFFERENTIAL/PLATELET
Abs Immature Granulocytes: 0.15 10*3/uL — ABNORMAL HIGH (ref 0.00–0.07)
BASOS ABS: 0 10*3/uL (ref 0.0–0.1)
Basophils Relative: 0 %
Eosinophils Absolute: 0 10*3/uL (ref 0.0–0.5)
Eosinophils Relative: 0 %
HCT: 39.8 % (ref 39.0–52.0)
Hemoglobin: 13.7 g/dL (ref 13.0–17.0)
Immature Granulocytes: 1 %
Lymphocytes Relative: 7 %
Lymphs Abs: 1.2 10*3/uL (ref 0.7–4.0)
MCH: 29.1 pg (ref 26.0–34.0)
MCHC: 34.4 g/dL (ref 30.0–36.0)
MCV: 84.7 fL (ref 80.0–100.0)
Monocytes Absolute: 1.1 10*3/uL — ABNORMAL HIGH (ref 0.1–1.0)
Monocytes Relative: 6 %
NRBC: 0 % (ref 0.0–0.2)
Neutro Abs: 14.5 10*3/uL — ABNORMAL HIGH (ref 1.7–7.7)
Neutrophils Relative %: 86 %
Platelets: 508 10*3/uL — ABNORMAL HIGH (ref 150–400)
RBC: 4.7 MIL/uL (ref 4.22–5.81)
RDW: 12.1 % (ref 11.5–15.5)
WBC: 17 10*3/uL — ABNORMAL HIGH (ref 4.0–10.5)

## 2019-03-01 LAB — COMPREHENSIVE METABOLIC PANEL
ALT: 542 U/L — ABNORMAL HIGH (ref 0–44)
AST: 346 U/L — ABNORMAL HIGH (ref 15–41)
Albumin: 3.7 g/dL (ref 3.5–5.0)
Alkaline Phosphatase: 422 U/L — ABNORMAL HIGH (ref 38–126)
Anion gap: 12 (ref 5–15)
BUN: 8 mg/dL (ref 6–20)
CO2: 25 mmol/L (ref 22–32)
Calcium: 9.2 mg/dL (ref 8.9–10.3)
Chloride: 99 mmol/L (ref 98–111)
Creatinine, Ser: 0.8 mg/dL (ref 0.61–1.24)
GFR calc Af Amer: >60 mL/min (ref >60)
GFR calc non Af Amer: >60 mL/min (ref >60)
Glucose, Bld: 210 mg/dL — ABNORMAL HIGH (ref 70–99)
Potassium: 3 mmol/L — ABNORMAL LOW (ref 3.5–5.1)
Sodium: 136 mmol/L (ref 135–145)
Total Bilirubin: 6.8 mg/dL — ABNORMAL HIGH (ref 0.3–1.2)
Total Protein: 8.2 g/dL — ABNORMAL HIGH (ref 6.5–8.1)

## 2019-03-01 LAB — LIPASE, BLOOD: Lipase: 26 U/L (ref 11–51)

## 2019-03-01 LAB — GLUCOSE, CAPILLARY
Glucose-Capillary: 167 mg/dL — ABNORMAL HIGH (ref 70–99)
Glucose-Capillary: 103 mg/dL — ABNORMAL HIGH (ref 70–99)

## 2019-03-01 MED ORDER — MORPHINE SULFATE (PF) 2 MG/ML IV SOLN
2.0000 mg | INTRAVENOUS | Status: DC | PRN
  Administered 2019-03-01 – 2019-03-02 (×2): 2 mg via INTRAVENOUS
  Filled 2019-03-01: qty 1

## 2019-03-01 MED ORDER — OXYCODONE HCL 5 MG PO TABS
5.0000 mg | ORAL_TABLET | ORAL | Status: DC | PRN
  Administered 2019-03-01 – 2019-03-02 (×4): 5 mg via ORAL
  Filled 2019-03-01 (×4): qty 1

## 2019-03-01 MED ORDER — INSULIN ASPART 100 UNIT/ML ~~LOC~~ SOLN
0.0000 [IU] | SUBCUTANEOUS | Status: DC
  Administered 2019-03-01: 2 [IU] via SUBCUTANEOUS
  Administered 2019-03-02: 1 [IU] via SUBCUTANEOUS
  Administered 2019-03-02: 3 [IU] via SUBCUTANEOUS
  Administered 2019-03-02: 2 [IU] via SUBCUTANEOUS
  Administered 2019-03-02 – 2019-03-03 (×3): 1 [IU] via SUBCUTANEOUS
  Filled 2019-03-01 (×7): qty 1

## 2019-03-01 MED ORDER — GADOBUTROL 1 MMOL/ML IV SOLN
10.0000 mL | Freq: Once | INTRAVENOUS | Status: AC | PRN
  Administered 2019-03-01: 10 mL via INTRAVENOUS

## 2019-03-01 MED ORDER — ACETAMINOPHEN 500 MG PO TABS
1000.0000 mg | ORAL_TABLET | Freq: Four times a day (QID) | ORAL | Status: DC
  Administered 2019-03-01 – 2019-03-03 (×7): 1000 mg via ORAL
  Filled 2019-03-01 (×7): qty 2

## 2019-03-01 MED ORDER — PIPERACILLIN-TAZOBACTAM 3.375 G IVPB 30 MIN
3.3750 g | Freq: Once | INTRAVENOUS | Status: AC
  Administered 2019-03-01: 3.375 g via INTRAVENOUS
  Filled 2019-03-01: qty 50

## 2019-03-01 MED ORDER — LACTATED RINGERS IV SOLN
INTRAVENOUS | Status: DC
  Administered 2019-03-01 – 2019-03-02 (×2): via INTRAVENOUS

## 2019-03-01 MED ORDER — PIPERACILLIN-TAZOBACTAM 3.375 G IVPB
3.3750 g | Freq: Three times a day (TID) | INTRAVENOUS | Status: DC
  Administered 2019-03-01 – 2019-03-03 (×5): 3.375 g via INTRAVENOUS
  Filled 2019-03-01 (×6): qty 50

## 2019-03-01 MED ORDER — ONDANSETRON HCL 4 MG/2ML IJ SOLN: 4.0000 mg | Freq: Four times a day (QID) | INTRAMUSCULAR | Status: DC | PRN

## 2019-03-01 MED ORDER — ONDANSETRON HCL 4 MG/2ML IJ SOLN
4.0000 mg | Freq: Once | INTRAMUSCULAR | Status: AC
  Administered 2019-03-01: 4 mg via INTRAVENOUS
  Filled 2019-03-01: qty 2

## 2019-03-01 MED ORDER — FENTANYL CITRATE (PF) 100 MCG/2ML IJ SOLN
50.0000 ug | Freq: Once | INTRAMUSCULAR | Status: AC
  Administered 2019-03-01: 50 ug via INTRAVENOUS
  Filled 2019-03-01: qty 2

## 2019-03-01 MED ORDER — MORPHINE SULFATE (PF) 2 MG/ML IV SOLN
INTRAVENOUS | Status: AC
  Administered 2019-03-01: 2 mg via INTRAVENOUS
  Filled 2019-03-01: qty 1

## 2019-03-01 MED ORDER — ENOXAPARIN SODIUM 40 MG/0.4ML ~~LOC~~ SOLN: 40.0000 mg | SUBCUTANEOUS | Status: DC

## 2019-03-01 MED ORDER — ONDANSETRON 4 MG PO TBDP: 4.0000 mg | ORAL_TABLET | Freq: Four times a day (QID) | ORAL | Status: DC | PRN

## 2019-03-01 NOTE — ED Provider Notes (Signed)
[facility] Emergency Department Provider Note  ____________________________________________  Time seen: Approximately 10:12 AM  I have reviewed the triage vital signs and the nursing notes.   HISTORY  Chief Complaint Abdominal Pain   HPI James Kent is a 53 y.o. male POD 8 from lap chole who presents for evaluation of abdominal pain.  Patient reports that he was doing well until 2 days ago when he started having diffuse pressure abdominal pain which is worse on the upper quadrants.  Last bowel movement was 2 days ago.  For the last 2 days has had several daily episodes of nonbloody nonbilious emesis.  Patient reports that he is not passing gas since last night.  Feels mildly distended.  He was on hydrocodone at home 1 to 2 days ago for pain.  No fever or chills.  Past Medical History:  Diagnosis Date  . Asthma   . Diabetes mellitus without complication (HCC)   . Hypertension   . Thyroid disease     Patient Active Problem List   Diagnosis Date Noted  . Acute cholecystitis 02/21/2019    Past Surgical History:  Procedure Laterality Date  . CARPAL TUNNEL RELEASE    . CHOLECYSTECTOMY N/A 02/21/2019   Procedure: LAPAROSCOPIC CHOLECYSTECTOMY;  Surgeon: Sung Amabile, DO;  Location: [facility] ORS;  Service: General;  Laterality: N/A;    Prior to Admission medications   Medication Sig Start Date End Date Taking? Authorizing Provider  acetaminophen (TYLENOL) 325 MG tablet Take 2 tablets (650 mg total) by mouth every 8 (eight) hours as needed for up to 30 days for mild pain. 02/23/19 03/25/19  Tonna Boehringer, Isami, DO  amoxicillin-clavulanate (AUGMENTIN) 875-125 MG tablet Take 1 tablet by mouth 2 (two) times daily for 7 days. 02/23/19 03/02/19  Tonna Boehringer, Isami, DO  docusate sodium (COLACE) 100 MG capsule Take 1 capsule (100 mg total) by mouth 2 (two) times daily as needed for up to 10 days for mild constipation. 02/23/19 03/05/19  Tonna Boehringer, Isami, DO  ibuprofen (ADVIL,MOTRIN) 800 MG  tablet Take 1 tablet (800 mg total) by mouth every 8 (eight) hours as needed for mild pain or moderate pain. 02/23/19   Sung Amabile, DO    Allergies Patient has no known allergies.  History reviewed. No pertinent family history.  Social History Social History   Tobacco Use  . Smoking status: Current Every Day Smoker    Packs/day: 0.50    Types: Cigarettes  . Smokeless tobacco: Never Used  Substance Use Topics  . Alcohol use: Never    Frequency: Never  . Drug use: Never    Review of Systems  Constitutional: Negative for fever. Eyes: Negative for visual changes. ENT: Negative for sore throat. Neck: No neck pain  Cardiovascular: Negative for chest pain. Respiratory: Negative for shortness of breath. Gastrointestinal: + abdominal pain, vomiting and constipation Genitourinary: Negative for dysuria. Musculoskeletal: Negative for back pain. Skin: Negative for rash. Neurological: Negative for headaches, weakness or numbness. Psych: No SI or HI  ____________________________________________   PHYSICAL EXAM:  VITAL SIGNS: ED Triage Vitals  Enc Vitals Group     BP 03/01/19 0947 (!) 176/89     Pulse Rate 03/01/19 0947 83     Resp 03/01/19 0947 16     Temp 03/01/19 0947 98.1 F (36.7 C)     Temp Source 03/01/19 0947 Oral     SpO2 03/01/19 0947 96 %     Weight 03/01/19 0941 220 lb (99.8 kg)     Height 03/01/19  0941 5\' 7"  (1.702 m)     Head Circumference --      Peak Flow --      Pain Score 03/01/19 0941 7     Pain Loc --      Pain Edu? --      Excl. in GC? --     Constitutional: Alert and oriented. Well appearing and in no apparent distress. HEENT:      Head: Normocephalic and atraumatic.         Eyes: Conjunctivae are normal. Sclera is non-icteric.       Mouth/Throat: Mucous membranes are moist.       Neck: Supple with no signs of meningismus. Cardiovascular: Regular rate and rhythm. No murmurs, gallops, or rubs. 2+ symmetrical distal pulses are present in all  extremities. No JVD. Respiratory: Normal respiratory effort. Lungs are clear to auscultation bilaterally. No wheezes, crackles, or rhonchi.  Gastrointestinal: Soft, well healing surgical scars, tenderness to palpation of the epigastric and right upper quadrant region, and non distended with positive bowel sounds. No rebound or guarding. Genitourinary: No CVA tenderness. Musculoskeletal: Nontender with normal range of motion in all extremities. No edema, cyanosis, or erythema of extremities. Neurologic: Normal speech and language. Face is symmetric. Moving all extremities. No gross focal neurologic deficits are appreciated. Skin: Skin is warm, dry and intact. No rash noted. Psychiatric: Mood and affect are normal. Speech and behavior are normal.  ____________________________________________   LABS (all labs ordered are listed, but only abnormal results are displayed)  Labs Reviewed  CBC WITH DIFFERENTIAL/PLATELET - Abnormal; Notable for the following components:      Result Value   WBC 17.0 (*)    Platelets 508 (*)    Neutro Abs 14.5 (*)    Monocytes Absolute 1.1 (*)    Abs Immature Granulocytes 0.15 (*)    All other components within normal limits  COMPREHENSIVE METABOLIC PANEL - Abnormal; Notable for the following components:   Potassium 3.0 (*)    Glucose, Bld 210 (*)    Total Protein 8.2 (*)    AST 346 (*)    ALT 542 (*)    Alkaline Phosphatase 422 (*)    Total Bilirubin 6.8 (*)    All other components within normal limits  LIPASE, BLOOD   ____________________________________________  EKG  none  ____________________________________________  RADIOLOGY  I have personally reviewed the images performed during this visit and I agree with the Radiologist's read.   Interpretation by Radiologist:  Dg Abd 2 Views  Result Date: 03/01/2019 CLINICAL DATA:  Epigastric pain.  Recent cholecystectomy. EXAM: ABDOMEN - 2 VIEW COMPARISON:  None. FINDINGS: Visualized lung bases clear.  No free air.  Cholecystectomy clips. Normal bowel gas pattern. Pelvic phleboliths. Regional bones unremarkable. IMPRESSION: Negative. Electronically Signed   By: Corlis Leak M.D.   On: 03/01/2019 10:25   US Abdomen Limited Ruq  Result Date: 03/01/2019 CLINICAL DATA:  Transaminitis.  Recent cholecystectomy 02/21/2019. EXAM: ULTRASOUND ABDOMEN LIMITED RIGHT UPPER QUADRANT COMPARISON:  Ultrasound 02/21/2019 and MRCP 02/21/2019. FINDINGS: Gallbladder: Recent cholecystectomy. Common bile duct: Diameter: 1 cm. Liver: No focal lesion identified. Within normal limits in parenchymal echogenicity. Suggestion of minimal central intrahepatic duct dilatation likely related to recent cholecystectomy. Portal vein is patent on color Doppler imaging with normal direction of blood flow towards the liver. IMPRESSION: Evidence of recent cholecystectomy.  No acute findings. Electronically Signed   By: Elberta Fortis M.D.   On: 03/01/2019 11:08      ____________________________________________  PROCEDURES  Procedure(s) performed: None Procedures Critical Care performed: yes  CRITICAL CARE Performed by: Nita Sickle  ?  Total critical care time: 40 min  Critical care time was exclusive of separately billable procedures and treating other patients.  Critical care was necessary to treat or prevent imminent or life-threatening deterioration.  Critical care was time spent personally by me on the following activities: development of treatment plan with patient and/or surrogate as well as nursing, discussions with consultants, evaluation of patient's response to treatment, examination of patient, obtaining history from patient or surrogate, ordering and performing treatments and interventions, ordering and review of laboratory studies, ordering and review of radiographic studies, pulse oximetry and re-evaluation of patient's condition.   ____________________________________________   INITIAL IMPRESSION /  ASSESSMENT AND PLAN / ED COURSE   53 y.o. male POD 8 from lap chole who presents for evaluation of abdominal pain, vomiting x 2 days.  Ddx ileus, SBO, post-op complications such as intra-abdominal abscess, retained stone, pancreatitis.  Plan for labs, imaging. Will give fentanyl and zofran for symptoms.   Clinical Course as of Feb 28 1309  Sun Mar 01, 2019  1123 Labs showing elevated white count and T bili and LFTs concerning for retained stone.  Patient was sent for right upper quadrant ultrasound which does not see a stone and a normal common bile duct.  Discussed with Dr. Norma Fredrickson from GI who recommended a CT scan of the abdomen and pelvis to rule out an intra-abdominal abscess or bile leak that could be causing the findings seen on lab.  He also recommended an MRCP to evaluate for possible retained stone.  In the meantime patient was given a dose of Zosyn to prevent development of ascending cholangitis.  There is no signs of such at this time.   [CV]    Clinical Course User Index [CV] Don Perking, [place], MD   _________________________ 1:10 PM on 03/01/2019 -----------------------------------------  MRI negative for common bile duct stone but it is showing a complex fluid collection in the gallbladder fossa concerning for an abscess.  Discussed with Dr. Satira Mccallum from surgery who will admit patient for IV antibiotics and possible IR drainage.  As part of my medical decision making, I reviewed the following data within the electronic MEDICAL RECORD NUMBER Nursing notes reviewed and incorporated, Labs reviewed , Old chart reviewed, Radiograph reviewed , Discussed with admitting physician , A consult was requested and obtained from this/these consultant(s) Surgery and GI, Notes from prior ED visits and [place] Controlled Substance Database    Pertinent labs & imaging results that were available during my care of the patient were reviewed by me and considered in my medical decision making (see chart  for details).    ____________________________________________   FINAL CLINICAL IMPRESSION(S) / ED DIAGNOSES  Final diagnoses:  Abdominal pain  Transaminitis  Intra-abdominal abscess (HCC)      NEW MEDICATIONS STARTED DURING THIS VISIT:  ED Discharge Orders    None       Note:  This document was prepared using Dragon voice recognition software and may include unintentional dictation errors.    Don Perking, [place], MD 03/01/19 1311

## 2019-03-01 NOTE — ED Notes (Signed)
Pt to mri 

## 2019-03-01 NOTE — ED Notes (Signed)
Pt had gallbladder surgery last Saturday emergently from ED - He was discharged home Monday - his last BM was Wednesday and he stopped being able to pass gas yesterday - he reports vomiting with all intake - he reports that he stopped taking "all" medication for all conditions on Friday

## 2019-03-01 NOTE — ED Notes (Signed)
ED TO INPATIENT HANDOFF REPORT  ED Nurse Name and Phone #: Marycatherine Maniscalco [phone]  S Name/Age/Gender James Kent 53 y.o. male Room/Bed: ED17A/ED17A  Code Status   Code Status: Prior  Home/SNF/Other Home Patient oriented to: a&ox3 Is this baseline? Yes   Triage Complete: Triage complete  Chief Complaint vomiting,abd pain,post-op  Triage Note FIRST NURSE NOTE-had cholecystectomy last Saturday.  Here today for abdominal pain and vomiting.  Has been constipated, no bowel movement despite suppositories.    Last bowel movement Wednesday.  Has had increased vomiting and abdominal pain.  Stopped taking all meds per pt because wasn't sure whats going on.  Denies narcotics after his surgery.  Pt has had vomiting and unable to keep anything down now.  No fever.     Allergies No Known Allergies  Level of Care/Admitting Diagnosis ED Disposition    ED Disposition Condition Comment   Admit  The patient appears reasonably stabilized for admission considering the current resources, flow, and capabilities available in the ED at this time, and I doubt any other [facility] requiring further screening and/or treatment in the ED prior to admission is  present.       B Medical/Surgery History Past Medical History:  Diagnosis Date  . Asthma   . Diabetes mellitus without complication (HCC)   . Hypertension   . Thyroid disease    Past Surgical History:  Procedure Laterality Date  . CARPAL TUNNEL RELEASE    . CHOLECYSTECTOMY N/A 02/21/2019   Procedure: LAPAROSCOPIC CHOLECYSTECTOMY;  Surgeon: Sung Amabile, DO;  Location: [facility] ORS;  Service: General;  Laterality: N/A;     A IV Location/Drains/Wounds Patient Lines/Drains/Airways Status   Active Line/Drains/Airways    Name:   Placement date:   Placement time:   Site:   Days:   Peripheral IV 03/01/19 Left Antecubital   03/01/19    0956    Antecubital   less than 1   Closed System Drain 1 Right Abdomen Bulb (JP) 15 Fr.   02/21/19    1025    Abdomen   8   Incision - 4 Ports Abdomen Upper Upper;Right Lower;Right Umbilicus   02/21/19    0847     8          Intake/Output Last 24 hours No intake or output data in the 24 hours ending 03/01/19 1349  Labs/Imaging Results for orders placed or performed during the hospital encounter of 03/01/19 (from the past 48 hour(s))  CBC with Differential/Platelet     Status: Abnormal   Collection Time: 03/01/19  9:57 AM  Result Value Ref Range   WBC 17.0 (H) 4.0 - 10.5 K/uL   RBC 4.70 4.22 - 5.81 MIL/uL   Hemoglobin 13.7 13.0 - 17.0 g/dL   HCT 16.1 09.6 - 04.5 %   MCV 84.7 80.0 - 100.0 fL   MCH 29.1 26.0 - 34.0 pg   MCHC 34.4 30.0 - 36.0 g/dL   RDW 40.9 81.1 - 91.4 %   Platelets 508 (H) 150 - 400 K/uL   nRBC 0.0 0.0 - 0.2 %   Neutrophils Relative % 86 %   Neutro Abs 14.5 (H) 1.7 - 7.7 K/uL   Lymphocytes Relative 7 %   Lymphs Abs 1.2 0.7 - 4.0 K/uL   Monocytes Relative 6 %   Monocytes Absolute 1.1 (H) 0.1 - 1.0 K/uL   Eosinophils Relative 0 %   Eosinophils Absolute 0.0 0.0 - 0.5 K/uL   Basophils Relative 0 %   Basophils Absolute 0.0  0.0 - 0.1 K/uL   Immature Granulocytes 1 %   Abs Immature Granulocytes 0.15 (H) 0.00 - 0.07 K/uL    Comment: Performed at [facility], [address] [place], [place] [address]  Comprehensive metabolic panel     Status: Abnormal   Collection Time: 03/01/19  9:57 AM  Result Value Ref Range   Sodium 136 135 - 145 mmol/L   Potassium 3.0 (L) 3.5 - 5.1 mmol/L   Chloride 99 98 - 111 mmol/L   CO2 25 22 - 32 mmol/L   Glucose, Bld 210 (H) 70 - 99 mg/dL   BUN 8 6 - 20 mg/dL   Creatinine, Ser 8.32 0.61 - 1.24 mg/dL   Calcium 9.2 8.9 - 54.9 mg/dL   Total Protein 8.2 (H) 6.5 - 8.1 g/dL   Albumin 3.7 3.5 - 5.0 g/dL   AST 826 (H) 15 - 41 U/L   ALT 542 (H) 0 - 44 U/L   Alkaline Phosphatase 422 (H) 38 - 126 U/L   Total Bilirubin 6.8 (H) 0.3 - 1.2 mg/dL   GFR calc non Af Amer >60 >60 mL/min   GFR calc Af Amer >60 >60 mL/min   Anion gap 12 5 - 15    Comment:  Performed at [facility], [address] [place], [place] [address]  Lipase, blood     Status: None   Collection Time: 03/01/19  9:57 AM  Result Value Ref Range   Lipase 26 11 - 51 U/L    Comment: Performed at [facility], [address], [place], [place] [address]   Dg Abd 2 Views  Result Date: 03/01/2019 CLINICAL DATA:  Epigastric pain.  Recent cholecystectomy. EXAM: ABDOMEN - 2 VIEW COMPARISON:  None. FINDINGS: Visualized lung bases clear. No free air.  Cholecystectomy clips. Normal bowel gas pattern. Pelvic phleboliths. Regional bones unremarkable. IMPRESSION: Negative. Electronically Signed   By: Corlis Leak M.D.   On: 03/01/2019 10:25   US Abdomen Limited Ruq  Result Date: 03/01/2019 CLINICAL DATA:  Transaminitis.  Recent cholecystectomy 02/21/2019. EXAM: ULTRASOUND ABDOMEN LIMITED RIGHT UPPER QUADRANT COMPARISON:  Ultrasound 02/21/2019 and MRCP 02/21/2019. FINDINGS: Gallbladder: Recent cholecystectomy. Common bile duct: Diameter: 1 cm. Liver: No focal lesion identified. Within normal limits in parenchymal echogenicity. Suggestion of minimal central intrahepatic duct dilatation likely related to recent cholecystectomy. Portal vein is patent on color Doppler imaging with normal direction of blood flow towards the liver. IMPRESSION: Evidence of recent cholecystectomy.  No acute findings. Electronically Signed   By: Elberta Fortis M.D.   On: 03/01/2019 11:08    Pending Labs Unresulted Labs (From admission, onward)   None      Vitals/Pain Today's Vitals   03/01/19 1130 03/01/19 1245 03/01/19 1300 03/01/19 1315  BP:  (!) 171/94 (!) 161/82 (!) 169/94  Pulse: 71 73 72 74  Resp:      Temp:      TempSrc:      SpO2: 96% 93% 91% 94%  Weight:      Height:      PainSc:        Isolation Precautions No active isolations  Medications Medications  ondansetron (ZOFRAN) injection 4 mg (4 mg Intravenous Given 03/01/19 1023)  fentaNYL (SUBLIMAZE) injection 50 mcg (50  mcg Intravenous Given 03/01/19 1026)  piperacillin-tazobactam (ZOSYN) IVPB 3.375 g (0 g Intravenous Stopped 03/01/19 1144)  fentaNYL (SUBLIMAZE) injection 50 mcg (50 mcg Intravenous Given 03/01/19 1235)  gadobutrol (GADAVIST) 1 MMOL/ML injection 10 mL (10 mLs Intravenous  Contrast Given 03/01/19 1223)    Mobility walks Low fall risk   Focused Assessments post surgery   R Recommendations: See Admitting Provider Note  Report given to:   Additional Notes:

## 2019-03-01 NOTE — H&P (Addendum)
SURGICAL HISTORY & PHYSICAL (cpt [phone])  HISTORY OF PRESENT ILLNESS (HPI):  53 y.o. male presented to [facility] ED today for abdominal pain. Patient reports he 8 days ago underwent laparoscopic cholecystectomy Tonna Boehringer, 02/21/2019) for acute cholecystitis and was discharged from hospital 6 days ago. He describes he was feeling well with anticipated upper abdominal peri-incisional discomfort, recently had his surgical drain removed by Dr. Tonna Boehringer, when 3 days ago his epigastric abdominal pain became more uncomfortable (Thursday evening). He says he slept on and off Thursday overnight, and by Friday (2 days ago), he says he was experiencing severe epigastric abdominal pain and multiple episodes of non-bloody emesis. He'd hoped this would resolve on its own, but presented today when it has not. He adds that he has alternated between feeling hot and cold, though has not checked his temperature to say whether or not he's been/felt febrile. His last BM was a small one this morning following suppository yesterday and today. He otherwise expresses upper abdominal pain with deep inspiration, but denies CP or SOB otherwise.  PAST MEDICAL HISTORY (PMH):  Past Medical History:  Diagnosis Date  . Asthma   . Diabetes mellitus without complication (HCC)   . Hypertension   . Thyroid disease     Reviewed. Otherwise negative.   PAST SURGICAL HISTORY (PSH):  Past Surgical History:  Procedure Laterality Date  . CARPAL TUNNEL RELEASE    . CHOLECYSTECTOMY N/A 02/21/2019   Procedure: LAPAROSCOPIC CHOLECYSTECTOMY;  Surgeon: Sung Amabile, DO;  Location: [facility] ORS;  Service: General;  Laterality: N/A;    Reviewed. Otherwise negative.   MEDICATIONS:  Prior to Admission medications   Medication Sig Start Date End Date Taking? Authorizing Provider  atorvastatin (LIPITOR) 40 MG tablet Take 40 mg by mouth daily.   Yes [provider]  fenofibrate 160 MG tablet Take 160 mg by mouth daily.   Yes [provider]   fluticasone furoate-vilanterol (BREO ELLIPTA) 100-25 MCG/INH AEPB Inhale 1 puff into the lungs daily.   Yes [provider]  gabapentin (NEURONTIN) 300 MG capsule Take 300 mg by mouth daily.   Yes [provider]  glipiZIDE (GLUCOTROL) 5 MG tablet Take 5 mg by mouth daily.   Yes [provider]  levothyroxine (SYNTHROID, LEVOTHROID) 200 MCG tablet Take 200 mcg by mouth daily.   Yes [provider]  lisinopril (PRINIVIL,ZESTRIL) 40 MG tablet Take 40 mg by mouth daily.   Yes [provider]  metFORMIN (GLUCOPHAGE) 1000 MG tablet Take 1,000 mg by mouth 2 (two) times daily.   Yes [provider]  omeprazole (PRILOSEC) 20 MG capsule Take 20 mg by mouth daily.   Yes [provider]    ALLERGIES:  No Known Allergies   SOCIAL HISTORY:  Social History   Socioeconomic History  . Marital status: Single    Spouse name: Not on file  . Number of children: Not on file  . Years of education: Not on file  . Highest education level: Not on file  Occupational History  . Not on file  Social Needs  . Financial resource strain: Not on file  . Food insecurity:    Worry: Not on file    Inability: Not on file  . Transportation needs:    Medical: Not on file    Non-medical: Not on file  Tobacco Use  . Smoking status: Current Every Day Smoker    Packs/day: 0.50    Types: Cigarettes  . Smokeless tobacco: Never Used  Substance and Sexual  Activity  . Alcohol use: Never    Frequency: Never  . Drug use: Never  . Sexual activity: Not on file  Lifestyle  . Physical activity:    Days per week: Not on file    Minutes per session: Not on file  . Stress: Not on file  Relationships  . Social connections:    Talks on phone: Not on file    Gets together: Not on file    Attends religious service: Not on file    Active member of club or organization: Not on file    Attends meetings of clubs or organizations: Not on file    Relationship status:  Not on file  . Intimate partner violence:    Fear of current or ex partner: Not on file    Emotionally abused: Not on file    Physically abused: Not on file    Forced sexual activity: Not on file  Other Topics Concern  . Not on file  Social History Narrative  . Not on file    The patient currently resides (home / rehab facility / nursing home): Home The patient normally is (ambulatory / bedbound): Ambulatory  FAMILY HISTORY:  History reviewed. No pertinent family history.  Otherwise negative.   REVIEW OF SYSTEMS:  Constitutional: fever/chills as per HPI, denies sweats or recent weight loss Eyes: denies any other vision changes, history of eye injury  ENT: denies sore throat, hearing problems  Respiratory: denies shortness of breath, wheezing  Cardiovascular: denies chest pain, palpitations  Gastrointestinal: abdominal pain, N/V, and bowel function as per HPI  Genitourinary: denies burning with urination or urinary frequency Musculoskeletal: denies any other joint pains or cramps  Skin: Denies any other rashes or skin discolorations  Neurological: denies any other headache, dizziness, weakness  Psychiatric: denies any other depression, anxiety   All other review of systems were otherwise negative.  VITAL SIGNS:  Temp:  [98.1 F (36.7 C)] 98.1 F (36.7 C) (03/22 0947) Pulse Rate:  [71-83] 74 (03/22 1315) Resp:  [16] 16 (03/22 0947) BP: (161-176)/(82-97) 169/94 (03/22 1315) SpO2:  [91 %-96 %] 94 % (03/22 1315) Weight:  [99.8 kg] 99.8 kg (03/22 0941)     Height: 5\' 7"  (170.2 cm) Weight: 99.8 kg BMI (Calculated): 34.45   INTAKE/OUTPUT:  This shift: No intake/output data recorded.  Last 2 shifts: @IOLAST2SHIFTS @  PHYSICAL EXAM:  Constitutional:  -- Obese body habitus  -- Awake, alert, and oriented x3, no apparent distress Eyes:  -- Pupils equally round and reactive to light  -- No scleral icterus, B/L no occular discharge Ear, nose, throat: -- Neck is FROM WNL -- No  jugular venous distension  Pulmonary:  -- No wheezes or rhales -- Equal breath sounds bilaterally -- Breathing non-labored at rest Cardiovascular:  -- S1, S2 present  -- No pericardial rubs  Gastrointestinal:  -- Abdomen soft and non-distended with moderate epigastric > RUQ tenderness to palpation, no guarding or rebound tenderness -- Post-surgical incisions well-approximated without surrounding erythema or drainage -- No abdominal masses appreciated, pulsatile or otherwise  Musculoskeletal and Integumentary:  -- Wounds or skin discoloration: None appreciated except post-surgical incisions as described above (GI) -- Extremities: B/L UE and LE FROM, hands and feet warm, no edema  Neurologic:  -- Motor function: Intact and symmetric -- Sensation: Intact and symmetric Psychiatric:  -- Mood and affect WNL  Labs:  CBC Latest Ref Rng & Units 03/01/2019 02/23/2019 02/22/2019  WBC 4.0 - 10.5 K/uL 17.0(H) 10.5 12.2(H)  Hemoglobin  13.0 - 17.0 g/dL 16.1 11.2(L) 11.8(L)  Hematocrit 39.0 - 52.0 % 39.8 34.3(L) 36.1(L)  Platelets 150 - 400 K/uL 508(H) 217 222   CMP Latest Ref Rng & Units 03/01/2019 02/23/2019 02/22/2019  Glucose 70 - 99 mg/dL 096(E) 454(U) 981(X)  BUN 6 - 20 mg/dL Creatinine 0.61 - 1.24 mg/dL 9.14 7.82 9.56  Sodium 135 - 145 mmol/L 136 140 139  Potassium 3.5 - 5.1 mmol/L 3.0(L) 3.6 3.3(L)  Chloride 98 - 111 mmol/L 99 109 107  CO2 22 - 32 mmol/L Calcium 8.9 - 10.3 mg/dL 9.2 2.1(H) 8.3(L)  Total Protein 6.5 - 8.1 g/dL 8.2(H) 6.0(L) 6.1(L)  Total Bilirubin 0.3 - 1.2 mg/dL 6.8(H) 1.4(H) 1.7(H)  Alkaline Phos 38 - 126 U/L 422(H) 58 36(L)  AST 15 - 41 U/L 346(H) 61(H) 36  ALT 0 - 44 U/L 542(H) 65(H) 49(H)   Imaging studies:  MRCP (03/01/2019) - Final interpretation not yet available at this time Prelim called to ER :05pm 03/01/19 to Dr Nita Sickle.  1. Mild dilatation intrahep and CBD without ducta stones.  2. Min complicated fluid collection in surg bed  8d post CCY measuring 4.2 x 8.3 cm containing small foci of air.  Concerning for post surg infection/abscess. Noninfect collection vs bile leak less likely.  Limited RUQ Abdominal Ultrasound (03/01/2019) No focal liver lesion identified. Within normal limits in parenchymal echogenicity. Suggestion of minimal central intrahepatic duct dilatation likely related to recent cholecystectomy. Portal vein is patent on color Doppler imaging with normal direction of blood flow towards the liver. Common bile duct diameter: 1 cm. Evidence of recent cholecystectomy.  No acute findings.   Assessment/Plan: (ICD-10's: K11.89) 53 y.o. male with post-cholecystectomy 4.2 cm x 8.3 cm fluid collection of the gallbladder fossa with small foci of air, leukocytosis, and hyperbilirubinemia, 8 Days s/p laparoscopic cholecystectomy Tonna Boehringer, 02/21/2019) for acute cholecystitis, complicated by pertinent comorbidities including DM, HTN, asthma, thyroid disease (not otherwise specified), and chronic ongoing tobacco abuse (smoking).    - NPO, IV fluids  - pain control as needed  - agree with Zosyn ordered by ED physician  - amenable to image-guided drainage tomorrow morning per IR Dr. Deanne Coffer  - may require possible subsequent HIDA +/- ERCP  - medical management of comorbidities  - discussed with Dr. Tonna Boehringer  - DVT prophylaxis  All of the above findings and recommendations were discussed with the patient, and all of his questions were answered to his expressed satisfaction.  -- Scherrie Gerlach Earlene Plater, MD, RPVI [place]: [facility] General Surgery - Partnering for exceptional care. Office: [phone]

## 2019-03-01 NOTE — Progress Notes (Addendum)
ED surgical consultation request just received from Dr. Don Perking. Patient discussed and antibiotics confirmed prescribed for what was reported to be post-cholecystectomy peritoneal fluid collection. This is not described on RUQ abdominal ultrasound, and MRCP interpretation is not yet available. Currently about to see 2 other surgical consultations, also just received, including one for surgery, after which will review MRCP and evaluate this patient with admission and further orders accordingly.  Please call if any questions or concerns in the meanwhile.  -- Scherrie Gerlach Earlene Plater, MD, RPVI [place]: [facility] General Surgery - Partnering for exceptional care. Office: [phone]

## 2019-03-01 NOTE — ED Triage Notes (Signed)
FIRST NURSE NOTE-had cholecystectomy last Saturday.  Here today for abdominal pain and vomiting.  Has been constipated, no bowel movement despite suppositories.

## 2019-03-01 NOTE — ED Triage Notes (Signed)
Last bowel movement Wednesday.  Has had increased vomiting and abdominal pain.  Stopped taking all meds per pt because wasn't sure whats going on.  Denies narcotics after his surgery.  Pt has had vomiting and unable to keep anything down now.  No fever.

## 2019-03-02 ENCOUNTER — Inpatient Hospital Stay: Payer: PRIVATE HEALTH INSURANCE

## 2019-03-02 DIAGNOSIS — K839 Disease of biliary tract, unspecified: Secondary | ICD-10-CM

## 2019-03-02 LAB — COMPREHENSIVE METABOLIC PANEL
ALT: 435 U/L — ABNORMAL HIGH (ref 0–44)
AST: 229 U/L — ABNORMAL HIGH (ref 15–41)
Albumin: 3.1 g/dL — ABNORMAL LOW (ref 3.5–5.0)
Alkaline Phosphatase: 373 U/L — ABNORMAL HIGH (ref 38–126)
Anion gap: 9 (ref 5–15)
BUN: 6 mg/dL (ref 6–20)
CO2: 27 mmol/L (ref 22–32)
Calcium: 8.8 mg/dL — ABNORMAL LOW (ref 8.9–10.3)
Chloride: 103 mmol/L (ref 98–111)
Creatinine, Ser: 0.58 mg/dL — ABNORMAL LOW (ref 0.61–1.24)
GFR calc Af Amer: >60 mL/min (ref >60)
GFR calc non Af Amer: >60 mL/min (ref >60)
Glucose, Bld: 140 mg/dL — ABNORMAL HIGH (ref 70–99)
Potassium: 3.1 mmol/L — ABNORMAL LOW (ref 3.5–5.1)
Sodium: 139 mmol/L (ref 135–145)
Total Bilirubin: 9 mg/dL — ABNORMAL HIGH (ref 0.3–1.2)
Total Protein: 7.4 g/dL (ref 6.5–8.1)

## 2019-03-02 LAB — GLUCOSE, CAPILLARY
Glucose-Capillary: 126 mg/dL — ABNORMAL HIGH (ref 70–99)
Glucose-Capillary: 113 mg/dL — ABNORMAL HIGH (ref 70–99)
Glucose-Capillary: 174 mg/dL — ABNORMAL HIGH (ref 70–99)
Glucose-Capillary: 248 mg/dL — ABNORMAL HIGH (ref 70–99)
Glucose-Capillary: 125 mg/dL — ABNORMAL HIGH (ref 70–99)

## 2019-03-02 LAB — CBC
HCT: 38 % — ABNORMAL LOW (ref 39.0–52.0)
Hemoglobin: 12.8 g/dL — ABNORMAL LOW (ref 13.0–17.0)
MCH: 28.8 pg (ref 26.0–34.0)
MCHC: 33.7 g/dL (ref 30.0–36.0)
MCV: 85.4 fL (ref 80.0–100.0)
Platelets: 465 10*3/uL — ABNORMAL HIGH (ref 150–400)
RBC: 4.45 MIL/uL (ref 4.22–5.81)
RDW: 12.3 % (ref 11.5–15.5)
WBC: 12.9 10*3/uL — ABNORMAL HIGH (ref 4.0–10.5)
nRBC: 0 % (ref 0.0–0.2)

## 2019-03-02 MED ORDER — SODIUM CHLORIDE 0.9% FLUSH
5.0000 mL | Freq: Three times a day (TID) | INTRAVENOUS | Status: DC
  Administered 2019-03-02 (×3): 5 mL

## 2019-03-02 MED ORDER — FENTANYL CITRATE (PF) 100 MCG/2ML IJ SOLN
INTRAMUSCULAR | Status: AC | PRN
  Administered 2019-03-02 (×2): 50 ug via INTRAVENOUS

## 2019-03-02 MED ORDER — LIDOCAINE HCL (PF) 1 % IJ SOLN
INTRAMUSCULAR | Status: AC | PRN
  Administered 2019-03-02: 10 mL

## 2019-03-02 MED ORDER — SODIUM CHLORIDE 0.9 % IV SOLN
INTRAVENOUS | Status: DC | PRN
  Administered 2019-03-02 – 2019-03-03 (×2): 250 mL via INTRAVENOUS

## 2019-03-02 MED ORDER — MIDAZOLAM HCL 5 MG/5ML IJ SOLN
INTRAMUSCULAR | Status: AC
  Filled 2019-03-02: qty 5

## 2019-03-02 MED ORDER — LACTATED RINGERS IV SOLN
INTRAVENOUS | Status: DC
  Administered 2019-03-02: 22:00:00 via INTRAVENOUS

## 2019-03-02 MED ORDER — MIDAZOLAM HCL 2 MG/2ML IJ SOLN
INTRAMUSCULAR | Status: AC | PRN
  Administered 2019-03-02 (×3): 1 mg via INTRAVENOUS

## 2019-03-02 MED ORDER — POTASSIUM CHLORIDE CRYS ER 20 MEQ PO TBCR
40.0000 meq | EXTENDED_RELEASE_TABLET | Freq: Once | ORAL | Status: AC
  Administered 2019-03-02: 40 meq via ORAL
  Filled 2019-03-02: qty 2

## 2019-03-02 MED ORDER — FENTANYL CITRATE (PF) 100 MCG/2ML IJ SOLN
INTRAMUSCULAR | Status: AC
  Filled 2019-03-02: qty 4

## 2019-03-02 NOTE — Procedures (Signed)
RUQ GB Fossa 10 Fr drain Bilious fluid EBL 0 Comp 0

## 2019-03-02 NOTE — Consult Note (Signed)
Wyline Mood , MD [address], [place], [place], [address] [address], [address], [place], [place], [address] Phone: [phone]  Fax: [phone]  Consultation  Referring Provider:  Dr Tonna Boehringer  Primary Care Physician:  System, Pcp Not In Primary Gastroenterologist:  None          Reason for Consultation:     ERCP  Date of Admission:  03/01/2019 Date of Consultation:  03/02/2019         HPI:   James Kent is a 53 y.o. male went a recent laparoscopic cholecystectomy on 02/21/2019..  Subsequently developed a collection in the right upper quadrant and noted to be in the gallbladder fossa.  He has just had an IR guided placement of a drain and the output was bilious suggestive of a bile leak.  Recent MRCP was performed which showed no calculus in the common bile duct but did show dilated intra-and extrahepatic bile ducts.  Common bile duct measures 11 mm on MRCP.  I have been consulted to determine if the patient requires an ERCP. He complains of some pain in the RUQ after drain being placed   Past Medical History:  Diagnosis Date   Asthma    Diabetes mellitus without complication (HCC)    Hypertension    Thyroid disease     Past Surgical History:  Procedure Laterality Date   CARPAL TUNNEL RELEASE     CHOLECYSTECTOMY N/A 02/21/2019   Procedure: LAPAROSCOPIC CHOLECYSTECTOMY;  Surgeon: Sung Amabile, DO;  Location: [facility] ORS;  Service: General;  Laterality: N/A;    Prior to Admission medications   Medication Sig Start Date End Date Taking? Authorizing Provider  atorvastatin (LIPITOR) 40 MG tablet Take 40 mg by mouth daily.   Yes [provider]  fenofibrate 160 MG tablet Take 160 mg by mouth daily.   Yes [provider]  fluticasone furoate-vilanterol (BREO ELLIPTA) 100-25 MCG/INH AEPB Inhale 1 puff into the lungs daily.   Yes [provider]  gabapentin (NEURONTIN) 300 MG capsule Take 300 mg by mouth daily.   Yes [provider]  glipiZIDE (GLUCOTROL) 5 MG tablet Take 5 mg by mouth daily.   Yes [provider]  levothyroxine (SYNTHROID, LEVOTHROID) 200 MCG tablet Take 200 mcg by mouth daily.   Yes [provider]  lisinopril (PRINIVIL,ZESTRIL) 40 MG tablet Take 40 mg by mouth daily.   Yes [provider]  metFORMIN (GLUCOPHAGE) 1000 MG tablet Take 1,000 mg by mouth 2 (two) times daily.   Yes [provider]  omeprazole (PRILOSEC) 20 MG capsule Take 20 mg by mouth daily.   Yes [provider]    History reviewed. No pertinent family history.   Social History   Tobacco Use   Smoking status: Current Every Day Smoker    Packs/day: 0.50    Types: Cigarettes   Smokeless tobacco: Never Used  Substance Use Topics   Alcohol use: Never    Frequency: Never   Drug use: Never    Allergies as of 03/01/2019   (No Known Allergies)    Review of Systems:    All systems reviewed and negative except where noted in HPI.   Physical Exam:  Vital signs in last 24 hours: Temp:  [97.5 F (36.4 C)-100.6 F (38.1 C)] 100.6 F (38.1 C) (03/23 1127) Pulse Rate:  [62-79] 73 (03/23 1127) Resp:  [11-20] 20 (03/23 1127) BP: (143-175)/(65-101) 164/87 (03/23 1127) SpO2:  [91 %-98 %] 95 % (03/23 1127)  General:   Pleasant, cooperative in NAD Head:  Normocephalic and atraumatic. Eyes:   No icterus.   Conjunctiva pink. PERRLA. Ears:  Normal auditory acuity. Neck:  Supple; no masses or thyroidomegaly Lungs: Respirations even and unlabored. Lungs clear to auscultation bilaterally.   No wheezes, crackles, or rhonchi.  Heart:  Regular rate and rhythm;  Without murmur, clicks, rubs or gallops Abdomen:  Soft, nondistended, mild RUQ tenderness , drain present with bile noted. Normal bowel sounds. No appreciable masses or hepatomegaly.  No rebound or guarding.  Neurologic:  Alert and oriented x3;  grossly normal neurologically. Skin:  Intact without significant lesions or  rashes. Cervical Nodes:  No significant cervical adenopathy. Psych:  Alert and cooperative. Normal affect.  LAB RESULTS: Recent Labs    03/01/19 0957 03/02/19 0449  WBC 17.0* 12.9*  HGB 13.7 12.8*  HCT 39.8 38.0*  PLT 508* 465*   BMET Recent Labs    03/01/19 0957 03/02/19 0449  NA 136 139  K 3.0* 3.1*  CL 99 103  CO2 25 27  GLUCOSE 210* 140*  BUN 8 6  CREATININE 0.80 0.58*  CALCIUM 9.2 8.8*   LFT Recent Labs    03/02/19 0449  PROT 7.4  ALBUMIN 3.1*  AST 229*  ALT 435*  ALKPHOS 373*  BILITOT 9.0*   PT/INR No results for input(s): LABPROT, INR in the last 72 hours.  STUDIES: Mr 3d Recon At Scanner  Result Date: 03/01/2019 CLINICAL DATA:  Postoperative day 8 from laparoscopic cholecystectomy. Now with abdominal pain and elevated LFTs. EXAM: MRI ABDOMEN WITHOUT AND WITH CONTRAST (INCLUDING MRCP) TECHNIQUE: Multiplanar multisequence MR imaging of the abdomen was performed both before and after the administration of intravenous contrast. Heavily T2-weighted images of the biliary and pancreatic ducts were obtained, and three-dimensional MRCP images were rendered by post processing. CONTRAST:  10 cc Gadavist COMPARISON:  Ultrasound exam from earlier today.  MRI 02/21/2019 FINDINGS: Lower chest: Bibasilar atelectasis with tiny right pleural effusion. Hepatobiliary: 6 mm subcapsular cyst in the lateral liver is stable. Interval cholecystectomy. 8.2 x 4.3 cm complex fluid collection is identified in the gallbladder fossa. Tiny foci of signal void within the complex collection likely represent foci of gas. Delayed imaging shows some peripheral enhancement. Intra and extrahepatic bile ducts are dilated with extrahepatic common bile duct measuring up to 11 mm. No filling defect identified in the biliary tree to suggest choledocholithiasis. No ampullary mass lesion. Pancreas: No focal mass lesion. No dilatation of the main duct. No intraparenchymal cyst. No peripancreatic edema. Spleen:   No splenomegaly. No focal mass lesion. Adrenals/Urinary Tract: No adrenal nodule or mass. Kidneys unremarkable. Stomach/Bowel: Stomach is unremarkable. No gastric wall thickening. No evidence of outlet obstruction. No small bowel or colonic dilatation within the visualized abdomen. Vascular/Lymphatic: No abdominal aortic aneurysm. No abdominal aortic atherosclerotic calcification. There is no gastrohepatic or hepatoduodenal ligament lymphadenopathy. No intraperitoneal or retroperitoneal lymphadenopathy. Other:  No perihepatic free fluid.  No intraperitoneal free fluid. Musculoskeletal: No abnormal marrow enhancement within the visualized bony anatomy. IMPRESSION: 1. 8 x 4 cm complex fluid collection in the gallbladder fossa with probable tiny gas locules. This is more fluid than expected 8 days after surgery. Delayed imaging suggests some peripheral enhancement of the fluid collection. Imaging features suspicious for superinfection of a postoperative fluid collection/evolving abscess. Bile leak not entirely excluded although no evidence for perihepatic fluid. 2. Intra and extrahepatic biliary duct dilatation without evidence for choledocholithiasis. Electronically Signed   By: Jamison Oka.D.  On: 03/01/2019 15:33   Dg Abd 2 Views  Result Date: 03/01/2019 CLINICAL DATA:  Epigastric pain.  Recent cholecystectomy. EXAM: ABDOMEN - 2 VIEW COMPARISON:  None. FINDINGS: Visualized lung bases clear. No free air.  Cholecystectomy clips. Normal bowel gas pattern. Pelvic phleboliths. Regional bones unremarkable. IMPRESSION: Negative. Electronically Signed   By: Corlis Leak M.D.   On: 03/01/2019 10:25   Mr Abdomen Mrcp Vivien Rossetti Contast  Result Date: 03/01/2019 CLINICAL DATA:  Postoperative day 8 from laparoscopic cholecystectomy. Now with abdominal pain and elevated LFTs. EXAM: MRI ABDOMEN WITHOUT AND WITH CONTRAST (INCLUDING MRCP) TECHNIQUE: Multiplanar multisequence MR imaging of the abdomen was performed both before  and after the administration of intravenous contrast. Heavily T2-weighted images of the biliary and pancreatic ducts were obtained, and three-dimensional MRCP images were rendered by post processing. CONTRAST:  10 cc Gadavist COMPARISON:  Ultrasound exam from earlier today.  MRI 02/21/2019 FINDINGS: Lower chest: Bibasilar atelectasis with tiny right pleural effusion. Hepatobiliary: 6 mm subcapsular cyst in the lateral liver is stable. Interval cholecystectomy. 8.2 x 4.3 cm complex fluid collection is identified in the gallbladder fossa. Tiny foci of signal void within the complex collection likely represent foci of gas. Delayed imaging shows some peripheral enhancement. Intra and extrahepatic bile ducts are dilated with extrahepatic common bile duct measuring up to 11 mm. No filling defect identified in the biliary tree to suggest choledocholithiasis. No ampullary mass lesion. Pancreas: No focal mass lesion. No dilatation of the main duct. No intraparenchymal cyst. No peripancreatic edema. Spleen:  No splenomegaly. No focal mass lesion. Adrenals/Urinary Tract: No adrenal nodule or mass. Kidneys unremarkable. Stomach/Bowel: Stomach is unremarkable. No gastric wall thickening. No evidence of outlet obstruction. No small bowel or colonic dilatation within the visualized abdomen. Vascular/Lymphatic: No abdominal aortic aneurysm. No abdominal aortic atherosclerotic calcification. There is no gastrohepatic or hepatoduodenal ligament lymphadenopathy. No intraperitoneal or retroperitoneal lymphadenopathy. Other:  No perihepatic free fluid.  No intraperitoneal free fluid. Musculoskeletal: No abnormal marrow enhancement within the visualized bony anatomy. IMPRESSION: 1. 8 x 4 cm complex fluid collection in the gallbladder fossa with probable tiny gas locules. This is more fluid than expected 8 days after surgery. Delayed imaging suggests some peripheral enhancement of the fluid collection. Imaging features suspicious for  superinfection of a postoperative fluid collection/evolving abscess. Bile leak not entirely excluded although no evidence for perihepatic fluid. 2. Intra and extrahepatic biliary duct dilatation without evidence for choledocholithiasis. Electronically Signed   By: Kennith Center M.D.   On: 03/01/2019 15:33   Ct Image Guided Drainage By Percutaneous Catheter  Result Date: 03/02/2019 INDICATION: Gallbladder fossa abscess EXAM: CT GUIDED DRAINAGE OF GALLBLADDER FOSSA ABSCESS MEDICATIONS: The patient is currently admitted to the hospital and receiving intravenous antibiotics. The antibiotics were administered within an appropriate time frame prior to the initiation of the procedure. ANESTHESIA/SEDATION: Three mg IV Versed 100 mcg IV Fentanyl Moderate Sedation Time:  10 minutes The patient was continuously monitored during the procedure by the interventional radiology nurse under my direct supervision. COMPLICATIONS: None immediate. TECHNIQUE: Informed written consent was obtained from the patient after a thorough discussion of the procedural risks, benefits and alternatives. All questions were addressed. Maximal Sterile Barrier Technique was utilized including caps, mask, sterile gowns, sterile gloves, sterile drape, hand hygiene and skin antiseptic. A timeout was performed prior to the initiation of the procedure. PROCEDURE: The right flank was prepped with ChloraPrep in a sterile fashion, and a sterile drape was applied covering the operative field. A sterile  gown and sterile gloves were used for the procedure. Local anesthesia was provided with 1% Lidocaine. Under CT guidance, an 18 gauge needle was advanced into the gallbladder fossa fluid collection. It was removed over an Amplatz wire. Ten [place] dilator followed by a 10 [place] drain were inserted. It was looped and string fixed in the fluid collection then sewn to the skin. Bilious fluid was aspirated. FINDINGS: Images document placement of a 10 French drain  in a gallbladder fossa fluid collection. IMPRESSION: Successful gallbladder fossa fluid collection 10 French drain placement. Electronically Signed   By: Jolaine Click M.D.   On: 03/02/2019 10:38   US Abdomen Limited Ruq  Result Date: 03/01/2019 CLINICAL DATA:  Transaminitis.  Recent cholecystectomy 02/21/2019. EXAM: ULTRASOUND ABDOMEN LIMITED RIGHT UPPER QUADRANT COMPARISON:  Ultrasound 02/21/2019 and MRCP 02/21/2019. FINDINGS: Gallbladder: Recent cholecystectomy. Common bile duct: Diameter: 1 cm. Liver: No focal lesion identified. Within normal limits in parenchymal echogenicity. Suggestion of minimal central intrahepatic duct dilatation likely related to recent cholecystectomy. Portal vein is patent on color Doppler imaging with normal direction of blood flow towards the liver. IMPRESSION: Evidence of recent cholecystectomy.  No acute findings. Electronically Signed   By: Elberta Fortis M.D.   On: 03/01/2019 11:08      Impression / Plan:   James Kent is a 53 y.o. y/o male underwent a recent cholecystectomy and subsequently has had a collection in the gallbladder fossa which required drainage earlier today by interventional radiology.  The output was bilious suggestive of a bile leak.  There is intra-and extrahepatic biliary dilation with a dilated common bile duct of 11 mm seen on MRCP.  There is no stone seen in the common bile duct but with an elevated total bilirubin and abnormal liver function tests I do still suspect we may see stones if he performed an ERCP.  I have discussed the patient with Dr. Servando Snare and we will schedule him for an ERCP for a stent placement for a bile leak tomorrow.  I have discussed alternative options, risks & benefits,  which include, but are not limited to, bleeding, infection,pancreatitis, perforation,respiratory complication , drug reaction and death.  The patient agrees with this plan & written consent will be obtained.     Thank you for involving me in the care of  this patient.      LOS: 1 day   Wyline Mood, MD  03/02/2019, 12:43 PM

## 2019-03-02 NOTE — Progress Notes (Signed)
Subjective:  CC: James Kent is a 53 y.o. male  Hospital stay day 1,   biliary leak s/p lap chole  HPI: In some discomfort after IR guided drain placement of fluid.  Bilious output.  Ate lunch despite being notified by RN to not eat anything until seen by GI.  ROS:  General: Denies weight loss, weight gain, fatigue, fevers, chills, and night sweats. Heart: Denies chest pain, palpitations, racing heart, irregular heartbeat, leg pain or swelling, and decreased activity tolerance. Respiratory: Denies breathing difficulty, shortness of breath, wheezing, cough, and sputum. GI: Denies change in appetite, heartburn, nausea, vomiting, constipation, diarrhea, and blood in stool. GU: Denies difficulty urinating, pain with urinating, urgency, frequency, blood in urine.   Objective:   Temp:  [97.5 F (36.4 C)-100.6 F (38.1 C)] 100.6 F (38.1 C) (03/23 1127) Pulse Rate:  [62-79] 73 (03/23 1127) Resp:  [11-20] 20 (03/23 1127) BP: (143-175)/(65-101) 164/87 (03/23 1127) SpO2:  [91 %-98 %] 95 % (03/23 1127)     Height: 5\' 7"  (170.2 cm) Weight: 99.8 kg BMI (Calculated): 34.45   Intake/Output this shift:   Intake/Output Summary (Last 24 hours) at 03/02/2019 1155 Last data filed at 03/02/2019 0807 Gross per 24 hour  Intake 1281.37 ml  Output 1100 ml  Net 181.37 ml    Constitutional :  alert, cooperative, appears stated age and mild distress  Respiratory:  clear to auscultation bilaterally  Cardiovascular:  regular rate and rhythm  Gastrointestinal: Soft, no guarding, but tender to exam in RUQ and around drain placement site, which has bilious output.   Skin: Cool and moist.   Psychiatric: Normal affect, non-agitated, not confused       LABS:  CMP Latest Ref Rng & Units 03/02/2019 03/01/2019 02/23/2019  Glucose 70 - 99 mg/dL 517(G) 017(C) 944(H)  BUN 6 - 20 mg/dL 6 8 7   Creatinine 0.61 - 1.24 mg/dL 6.75(F) 1.63 8.46  Sodium 135 - 145 mmol/L 139 136 140  Potassium 3.5 - 5.1 mmol/L 3.1(L)  3.0(L) 3.6  Chloride 98 - 111 mmol/L 103 99 109  CO2 22 - 32 mmol/L 27 25 26   Calcium 8.9 - 10.3 mg/dL 6.5(L) 9.2 9.3(T)  Total Protein 6.5 - 8.1 g/dL 7.4 7.0(V) 6.0(L)  Total Bilirubin 0.3 - 1.2 mg/dL 9.0(H) 6.8(H) 1.4(H)  Alkaline Phos 38 - 126 U/L 373(H) 422(H) 58  AST 15 - 41 U/L 229(H) 346(H) 61(H)  ALT 0 - 44 U/L 435(H) 542(H) 65(H)   CBC Latest Ref Rng & Units 03/02/2019 03/01/2019 02/23/2019  WBC 4.0 - 10.5 K/uL 12.9(H) 17.0(H) 10.5  Hemoglobin 13.0 - 17.0 g/dL 12.8(L) 13.7 11.2(L)  Hematocrit 39.0 - 52.0 % 38.0(L) 39.8 34.3(L)  Platelets 150 - 400 K/uL 465(H) 508(H) 217    RADS: n/a Assessment:   Biliary leak, with elevated LFTs s/p lap chole.  Now has IR guided drain placement in fluid collection noted on MRCP, draining bile.  Pending GI consult for further eval/treatment of bile leak.  Continue ABX, hold lovenox, NPO, until then.  Pt notified of plan and agreeable.  Continue to monitor for now.

## 2019-03-02 NOTE — Progress Notes (Signed)
Patient post Percutaneous Drain placement per Dr [facility] well. Vitals remained stable throughout procedure. Sinus rhythm per monitor, report called to Lenda Kelp, care nurse with questions answered.denies complaints at this time.

## 2019-03-02 NOTE — Consult Note (Signed)
Chief Complaint: Patient was seen in consultation today for  Chief Complaint  Patient presents with   Abdominal Pain   at the request of * No referring provider recorded for this case *  Referring Physician(s): * No referring provider recorded for this case *  Supervising Physician: Jolaine Click  Patient Status: [facility] - In-pt  History of Present Illness: James Kent is a 53 y.o. male who underwent laparoscopic cholecystectomy 9 days ago.  He recently presented with fever and abdominal pain with nausea and vomiting.  A subsequent MRI demonstrates fluid in the gallbladder fossa.  He is referred for abscess drain.  Past Medical History:  Diagnosis Date   Asthma    Diabetes mellitus without complication (HCC)    Hypertension    Thyroid disease     Past Surgical History:  Procedure Laterality Date   CARPAL TUNNEL RELEASE     CHOLECYSTECTOMY N/A 02/21/2019   Procedure: LAPAROSCOPIC CHOLECYSTECTOMY;  Surgeon: Sung Amabile, DO;  Location: [facility] ORS;  Service: General;  Laterality: N/A;    Allergies: Patient has no known allergies.  Medications: Prior to Admission medications   Medication Sig Start Date End Date Taking? Authorizing Provider  atorvastatin (LIPITOR) 40 MG tablet Take 40 mg by mouth daily.   Yes [provider]  fenofibrate 160 MG tablet Take 160 mg by mouth daily.   Yes [provider]  fluticasone furoate-vilanterol (BREO ELLIPTA) 100-25 MCG/INH AEPB Inhale 1 puff into the lungs daily.   Yes [provider]  gabapentin (NEURONTIN) 300 MG capsule Take 300 mg by mouth daily.   Yes [provider]  glipiZIDE (GLUCOTROL) 5 MG tablet Take 5 mg by mouth daily.   Yes [provider]  levothyroxine (SYNTHROID, LEVOTHROID) 200 MCG tablet Take 200 mcg by mouth daily.   Yes [provider]  lisinopril (PRINIVIL,ZESTRIL) 40 MG tablet Take 40 mg by mouth daily.   Yes [provider]  metFORMIN  (GLUCOPHAGE) 1000 MG tablet Take 1,000 mg by mouth 2 (two) times daily.   Yes [provider]  omeprazole (PRILOSEC) 20 MG capsule Take 20 mg by mouth daily.   Yes [provider]     History reviewed. No pertinent family history.  Social History   Socioeconomic History   Marital status: Single    Spouse name: Not on file   Number of children: Not on file   Years of education: Not on file   Highest education level: Not on file  Occupational History   Not on file  Social Needs   Financial resource strain: Not on file   Food insecurity:    Worry: Not on file    Inability: Not on file   Transportation needs:    Medical: Not on file    Non-medical: Not on file  Tobacco Use   Smoking status: Current Every Day Smoker    Packs/day: 0.50    Types: Cigarettes   Smokeless tobacco: Never Used  Substance and Sexual Activity   Alcohol use: Never    Frequency: Never   Drug use: Never   Sexual activity: Not on file  Lifestyle   Physical activity:    Days per week: Not on file    Minutes per session: Not on file   Stress: Not on file  Relationships   Social connections:    Talks on phone: Not on file    Gets together: Not on file    Attends religious service: Not on file  Active member of club or organization: Not on file    Attends meetings of clubs or organizations: Not on file    Relationship status: Not on file  Other Topics Concern   Not on file  Social History Narrative   Not on file     Review of Systems: A 12 point ROS discussed and pertinent positives are indicated in the HPI above.  All other systems are negative.  Review of Systems  Vital Signs: BP (!) 168/95    Pulse 62    Temp 99.2 F (37.3 C) (Oral)    Resp 20    Ht 5\' 7"  (1.702 m)    Wt 99.8 kg    SpO2 98%    BMI 34.46 kg/m   Physical Exam Constitutional:      Appearance: He is well-developed.  HENT:     Head: Normocephalic and atraumatic.  Cardiovascular:      Rate and Rhythm: Normal rate and regular rhythm.  Pulmonary:     Effort: Pulmonary effort is normal.     Breath sounds: Normal breath sounds.  Abdominal:     General: Abdomen is flat.     Palpations: Abdomen is soft.  Skin:    General: Skin is warm and dry.  Neurological:     General: No focal deficit present.     Mental Status: He is alert.     Imaging: Mr 3d Recon At Scanner  Result Date: 03/01/2019 CLINICAL DATA:  Postoperative day 8 from laparoscopic cholecystectomy. Now with abdominal pain and elevated LFTs. EXAM: MRI ABDOMEN WITHOUT AND WITH CONTRAST (INCLUDING MRCP) TECHNIQUE: Multiplanar multisequence MR imaging of the abdomen was performed both before and after the administration of intravenous contrast. Heavily T2-weighted images of the biliary and pancreatic ducts were obtained, and three-dimensional MRCP images were rendered by post processing. CONTRAST:  10 cc Gadavist COMPARISON:  Ultrasound exam from earlier today.  MRI 02/21/2019 FINDINGS: Lower chest: Bibasilar atelectasis with tiny right pleural effusion. Hepatobiliary: 6 mm subcapsular cyst in the lateral liver is stable. Interval cholecystectomy. 8.2 x 4.3 cm complex fluid collection is identified in the gallbladder fossa. Tiny foci of signal void within the complex collection likely represent foci of gas. Delayed imaging shows some peripheral enhancement. Intra and extrahepatic bile ducts are dilated with extrahepatic common bile duct measuring up to 11 mm. No filling defect identified in the biliary tree to suggest choledocholithiasis. No ampullary mass lesion. Pancreas: No focal mass lesion. No dilatation of the main duct. No intraparenchymal cyst. No peripancreatic edema. Spleen:  No splenomegaly. No focal mass lesion. Adrenals/Urinary Tract: No adrenal nodule or mass. Kidneys unremarkable. Stomach/Bowel: Stomach is unremarkable. No gastric wall thickening. No evidence of outlet obstruction. No small bowel or colonic  dilatation within the visualized abdomen. Vascular/Lymphatic: No abdominal aortic aneurysm. No abdominal aortic atherosclerotic calcification. There is no gastrohepatic or hepatoduodenal ligament lymphadenopathy. No intraperitoneal or retroperitoneal lymphadenopathy. Other:  No perihepatic free fluid.  No intraperitoneal free fluid. Musculoskeletal: No abnormal marrow enhancement within the visualized bony anatomy. IMPRESSION: 1. 8 x 4 cm complex fluid collection in the gallbladder fossa with probable tiny gas locules. This is more fluid than expected 8 days after surgery. Delayed imaging suggests some peripheral enhancement of the fluid collection. Imaging features suspicious for superinfection of a postoperative fluid collection/evolving abscess. Bile leak not entirely excluded although no evidence for perihepatic fluid. 2. Intra and extrahepatic biliary duct dilatation without evidence for choledocholithiasis. Electronically Signed   By: Jamison Oka.D.  On: 03/01/2019 15:33   Mr 3d Recon At Scanner  Result Date: 02/21/2019 CLINICAL DATA:  Upper abdominal pain, vomiting, possible dilated CBD on ultrasound EXAM: MRI ABDOMEN WITHOUT AND WITH CONTRAST (INCLUDING MRCP) TECHNIQUE: Multiplanar multisequence MR imaging of the abdomen was performed both before and after the administration of intravenous contrast. Heavily T2-weighted images of the biliary and pancreatic ducts were obtained, and three-dimensional MRCP images were rendered by post processing. CONTRAST:  10 mL Gadovist IV COMPARISON:  Right upper quadrant ultrasound dated 02/21/2019 FINDINGS: Lower chest: Lung bases are clear. Hepatobiliary: 6 mm cyst at the junction of segment 5 and 4B (series 5/image 18). Tiny layering gallstones with mild gallbladder distension. No convincing pericholecystic inflammatory changes to suggest acute cholecystitis. No intrahepatic or extrahepatic ductal dilatation. Common duct measures 8 mm, within the upper limits of  normal. No choledocholithiasis is seen. Pancreas:  Within normal limits. Spleen:  Within normal limits. Adrenals/Urinary Tract:  Adrenal glands are within normal limits. Kidneys are within normal limits.  No hydronephrosis. Stomach/Bowel: Stomach is within normal limits. Visualized bowel is unremarkable. Vascular/Lymphatic:  No evidence of abdominal aortic aneurysm. No suspicious abdominal lymphadenopathy. Other:  No abdominal ascites. Musculoskeletal: No focal osseous lesions. IMPRESSION: Mild cholelithiasis with gallbladder distention. No associated findings to suggest acute cholecystitis. No intrahepatic or extrahepatic ductal dilatation. Common duct measures 8 mm, at the upper limits of normal. No choledocholithiasis is seen. Electronically Signed   By: Charline Bills M.D.   On: 02/21/2019 05:35   Dg Abd 2 Views  Result Date: 03/01/2019 CLINICAL DATA:  Epigastric pain.  Recent cholecystectomy. EXAM: ABDOMEN - 2 VIEW COMPARISON:  None. FINDINGS: Visualized lung bases clear. No free air.  Cholecystectomy clips. Normal bowel gas pattern. Pelvic phleboliths. Regional bones unremarkable. IMPRESSION: Negative. Electronically Signed   By: Corlis Leak M.D.   On: 03/01/2019 10:25   Mr Abdomen Mrcp Vivien Rossetti Contast  Result Date: 03/01/2019 CLINICAL DATA:  Postoperative day 8 from laparoscopic cholecystectomy. Now with abdominal pain and elevated LFTs. EXAM: MRI ABDOMEN WITHOUT AND WITH CONTRAST (INCLUDING MRCP) TECHNIQUE: Multiplanar multisequence MR imaging of the abdomen was performed both before and after the administration of intravenous contrast. Heavily T2-weighted images of the biliary and pancreatic ducts were obtained, and three-dimensional MRCP images were rendered by post processing. CONTRAST:  10 cc Gadavist COMPARISON:  Ultrasound exam from earlier today.  MRI 02/21/2019 FINDINGS: Lower chest: Bibasilar atelectasis with tiny right pleural effusion. Hepatobiliary: 6 mm subcapsular cyst in the lateral  liver is stable. Interval cholecystectomy. 8.2 x 4.3 cm complex fluid collection is identified in the gallbladder fossa. Tiny foci of signal void within the complex collection likely represent foci of gas. Delayed imaging shows some peripheral enhancement. Intra and extrahepatic bile ducts are dilated with extrahepatic common bile duct measuring up to 11 mm. No filling defect identified in the biliary tree to suggest choledocholithiasis. No ampullary mass lesion. Pancreas: No focal mass lesion. No dilatation of the main duct. No intraparenchymal cyst. No peripancreatic edema. Spleen:  No splenomegaly. No focal mass lesion. Adrenals/Urinary Tract: No adrenal nodule or mass. Kidneys unremarkable. Stomach/Bowel: Stomach is unremarkable. No gastric wall thickening. No evidence of outlet obstruction. No small bowel or colonic dilatation within the visualized abdomen. Vascular/Lymphatic: No abdominal aortic aneurysm. No abdominal aortic atherosclerotic calcification. There is no gastrohepatic or hepatoduodenal ligament lymphadenopathy. No intraperitoneal or retroperitoneal lymphadenopathy. Other:  No perihepatic free fluid.  No intraperitoneal free fluid. Musculoskeletal: No abnormal marrow enhancement within the visualized bony anatomy. IMPRESSION: 1.  8 x 4 cm complex fluid collection in the gallbladder fossa with probable tiny gas locules. This is more fluid than expected 8 days after surgery. Delayed imaging suggests some peripheral enhancement of the fluid collection. Imaging features suspicious for superinfection of a postoperative fluid collection/evolving abscess. Bile leak not entirely excluded although no evidence for perihepatic fluid. 2. Intra and extrahepatic biliary duct dilatation without evidence for choledocholithiasis. Electronically Signed   By: Kennith Center M.D.   On: 03/01/2019 15:33   Mr Abdomen Mrcp Vivien Rossetti Contast  Result Date: 02/21/2019 CLINICAL DATA:  Upper abdominal pain, vomiting, possible  dilated CBD on ultrasound EXAM: MRI ABDOMEN WITHOUT AND WITH CONTRAST (INCLUDING MRCP) TECHNIQUE: Multiplanar multisequence MR imaging of the abdomen was performed both before and after the administration of intravenous contrast. Heavily T2-weighted images of the biliary and pancreatic ducts were obtained, and three-dimensional MRCP images were rendered by post processing. CONTRAST:  10 mL Gadovist IV COMPARISON:  Right upper quadrant ultrasound dated 02/21/2019 FINDINGS: Lower chest: Lung bases are clear. Hepatobiliary: 6 mm cyst at the junction of segment 5 and 4B (series 5/image 18). Tiny layering gallstones with mild gallbladder distension. No convincing pericholecystic inflammatory changes to suggest acute cholecystitis. No intrahepatic or extrahepatic ductal dilatation. Common duct measures 8 mm, within the upper limits of normal. No choledocholithiasis is seen. Pancreas:  Within normal limits. Spleen:  Within normal limits. Adrenals/Urinary Tract:  Adrenal glands are within normal limits. Kidneys are within normal limits.  No hydronephrosis. Stomach/Bowel: Stomach is within normal limits. Visualized bowel is unremarkable. Vascular/Lymphatic:  No evidence of abdominal aortic aneurysm. No suspicious abdominal lymphadenopathy. Other:  No abdominal ascites. Musculoskeletal: No focal osseous lesions. IMPRESSION: Mild cholelithiasis with gallbladder distention. No associated findings to suggest acute cholecystitis. No intrahepatic or extrahepatic ductal dilatation. Common duct measures 8 mm, at the upper limits of normal. No choledocholithiasis is seen. Electronically Signed   By: Charline Bills M.D.   On: 02/21/2019 05:35   US Abdomen Limited Ruq  Result Date: 03/01/2019 CLINICAL DATA:  Transaminitis.  Recent cholecystectomy 02/21/2019. EXAM: ULTRASOUND ABDOMEN LIMITED RIGHT UPPER QUADRANT COMPARISON:  Ultrasound 02/21/2019 and MRCP 02/21/2019. FINDINGS: Gallbladder: Recent cholecystectomy. Common bile  duct: Diameter: 1 cm. Liver: No focal lesion identified. Within normal limits in parenchymal echogenicity. Suggestion of minimal central intrahepatic duct dilatation likely related to recent cholecystectomy. Portal vein is patent on color Doppler imaging with normal direction of blood flow towards the liver. IMPRESSION: Evidence of recent cholecystectomy.  No acute findings. Electronically Signed   By: Elberta Fortis M.D.   On: 03/01/2019 11:08   US Abdomen Limited Ruq  Result Date: 02/21/2019 CLINICAL DATA:  Epigastric pain, nausea, and vomiting. EXAM: ULTRASOUND ABDOMEN LIMITED RIGHT UPPER QUADRANT COMPARISON:  None. FINDINGS: Gallbladder: Small amount of sludge layering in the gallbladder. No discrete stones identified. No gallbladder wall thickening or edema. Murphy's sign is positive. Common bile duct: Diameter: 7-8 mm, mildly dilated. Liver: No focal lesion identified. Within normal limits in parenchymal echogenicity. Portal vein is patent on color Doppler imaging with normal direction of blood flow towards the liver. IMPRESSION: Gallbladder sludge with positive Murphy's sign could indicate acute cholecystitis in the appropriate clinical setting although no stones are demonstrated. Mild bile duct dilatation. Cause is not identified. Correlation with liver function studies is recommended. Electronically Signed   By: Burman Nieves M.D.   On: 02/21/2019 01:50    Labs:  CBC: Recent Labs    02/22/19 0549 02/23/19 0401 03/01/19 0957 03/02/19 0449  WBC 12.2* 10.5 17.0* 12.9*  HGB 11.8* 11.2* 13.7 12.8*  HCT 36.1* 34.3* 39.8 38.0*  PLT 222 217 508* 465*    COAGS: No results for input(s): INR, APTT in the last 8760 hours.  BMP: Recent Labs    02/22/19 0549 02/23/19 0401 03/01/19 0957 03/02/19 0449  NA 139 140 136 139  K 3.3* 3.6 3.0* 3.1*  CL 107 109 99 103  CO2 GLUCOSE 134* 125* 210* 140*  BUN CALCIUM 8.3* 8.2* 9.2 8.8*  CREATININE 0.68 0.77 0.80 0.58*    GFRNONAA >60 >60 >60 >60  GFRAA >60 >60 >60 >60    LIVER FUNCTION TESTS: Recent Labs    02/22/19 0549 02/23/19 0401 03/01/19 0957 03/02/19 0449  BILITOT 1.7* 1.4* 6.8* 9.0*  AST 36 61* 346* 229*  ALT 49* 65* 542* 435*  ALKPHOS 36* 58 422* 373*  PROT 6.1* 6.0* 8.2* 7.4  ALBUMIN 3.2* 2.9* 3.7 3.1*    TUMOR MARKERS: No results for input(s): AFPTM, CEA, CA199, CHROMGRNA in the last 8760 hours.  Assessment and Plan:  Postoperative gallbladder fossa fluid collection may represent seroma, abscess, or biloma.  CT-guided drain is to follow.  Thank you for this interesting consult.  I greatly enjoyed meeting Talmage Teaster and look forward to participating in their care.  A copy of this report was sent to the requesting provider on this date.  Electronically Signed: Art A Keifer Habib, MD 03/02/2019, 8:52 AM   I spent a total of 55 Miinutes  in face to face in clinical consultation, greater than 50% of which was counseling/coordinating care for CT-guided abscess drain.

## 2019-03-03 ENCOUNTER — Inpatient Hospital Stay: Payer: PRIVATE HEALTH INSURANCE

## 2019-03-03 ENCOUNTER — Inpatient Hospital Stay: Payer: PRIVATE HEALTH INSURANCE | Admitting: Anesthesiology

## 2019-03-03 ENCOUNTER — Encounter: Payer: Self-pay | Admitting: *Deleted

## 2019-03-03 ENCOUNTER — Encounter
Admission: EM | Disposition: A | Discharge status: Home / Self Care | Payer: Self-pay | Source: Home / Self Care | Attending: Surgery

## 2019-03-03 DIAGNOSIS — K838 Other specified diseases of biliary tract: Secondary | ICD-10-CM

## 2019-03-03 DIAGNOSIS — K9189 Other postprocedural complications and disorders of digestive system: Secondary | ICD-10-CM

## 2019-03-03 DIAGNOSIS — K8050 Calculus of bile duct without cholangitis or cholecystitis without obstruction: Secondary | ICD-10-CM

## 2019-03-03 HISTORY — PX: ERCP: SHX5425

## 2019-03-03 LAB — BASIC METABOLIC PANEL
Anion gap: 7 (ref 5–15)
BUN: 7 mg/dL (ref 6–20)
CO2: 25 mmol/L (ref 22–32)
Calcium: 8.2 mg/dL — ABNORMAL LOW (ref 8.9–10.3)
Chloride: 105 mmol/L (ref 98–111)
Creatinine, Ser: 0.57 mg/dL — ABNORMAL LOW (ref 0.61–1.24)
GFR calc Af Amer: >60 mL/min (ref >60)
GFR calc non Af Amer: >60 mL/min (ref >60)
Glucose, Bld: 128 mg/dL — ABNORMAL HIGH (ref 70–99)
Potassium: 3.4 mmol/L — ABNORMAL LOW (ref 3.5–5.1)
Sodium: 137 mmol/L (ref 135–145)

## 2019-03-03 LAB — HEPATIC FUNCTION PANEL
ALBUMIN: 2.9 g/dL — AB (ref 3.5–5.0)
ALT: 372 U/L — ABNORMAL HIGH (ref 0–44)
AST: 220 U/L — ABNORMAL HIGH (ref 15–41)
Alkaline Phosphatase: 366 U/L — ABNORMAL HIGH (ref 38–126)
Bilirubin, Direct: 5.8 mg/dL — ABNORMAL HIGH (ref 0.0–0.2)
Indirect Bilirubin: 3.3 mg/dL — ABNORMAL HIGH (ref 0.3–0.9)
Total Bilirubin: 9.1 mg/dL — ABNORMAL HIGH (ref 0.3–1.2)
Total Protein: 6.7 g/dL (ref 6.5–8.1)

## 2019-03-03 LAB — CBC WITH DIFFERENTIAL/PLATELET
Abs Immature Granulocytes: 0.15 10*3/uL — ABNORMAL HIGH (ref 0.00–0.07)
Basophils Absolute: 0.1 10*3/uL (ref 0.0–0.1)
Basophils Relative: 1 %
EOS PCT: 2 %
Eosinophils Absolute: 0.2 10*3/uL (ref 0.0–0.5)
HCT: 36.7 % — ABNORMAL LOW (ref 39.0–52.0)
HEMOGLOBIN: 12.2 g/dL — AB (ref 13.0–17.0)
Immature Granulocytes: 1 %
LYMPHS PCT: 16 %
Lymphs Abs: 2 10*3/uL (ref 0.7–4.0)
MCH: 28.8 pg (ref 26.0–34.0)
MCHC: 33.2 g/dL (ref 30.0–36.0)
MCV: 86.8 fL (ref 80.0–100.0)
Monocytes Absolute: 0.8 10*3/uL (ref 0.1–1.0)
Monocytes Relative: 7 %
Neutro Abs: 8.8 10*3/uL — ABNORMAL HIGH (ref 1.7–7.7)
Neutrophils Relative %: 73 %
Platelets: 434 10*3/uL — ABNORMAL HIGH (ref 150–400)
RBC: 4.23 MIL/uL (ref 4.22–5.81)
RDW: 12.4 % (ref 11.5–15.5)
WBC: 12 10*3/uL — ABNORMAL HIGH (ref 4.0–10.5)
nRBC: 0 % (ref 0.0–0.2)

## 2019-03-03 LAB — HIV ANTIBODY (ROUTINE TESTING W REFLEX): HIV Screen 4th Generation wRfx: NONREACTIVE

## 2019-03-03 LAB — GLUCOSE, CAPILLARY
GLUCOSE-CAPILLARY: 99 mg/dL (ref 70–99)
Glucose-Capillary: 100 mg/dL — ABNORMAL HIGH (ref 70–99)
Glucose-Capillary: 109 mg/dL — ABNORMAL HIGH (ref 70–99)
Glucose-Capillary: 123 mg/dL — ABNORMAL HIGH (ref 70–99)
Glucose-Capillary: 128 mg/dL — ABNORMAL HIGH (ref 70–99)

## 2019-03-03 LAB — MAGNESIUM: MAGNESIUM: 1.8 mg/dL (ref 1.7–2.4)

## 2019-03-03 LAB — PHOSPHORUS: Phosphorus: 2.6 mg/dL (ref 2.5–4.6)

## 2019-03-03 SURGERY — ERCP, WITH INTERVENTION IF INDICATED
Anesthesia: General

## 2019-03-03 MED ORDER — MIDAZOLAM HCL 5 MG/5ML IJ SOLN
INTRAMUSCULAR | Status: DC | PRN
  Administered 2019-03-03: 2 mg via INTRAVENOUS

## 2019-03-03 MED ORDER — INDOMETHACIN 50 MG RE SUPP: 100.0000 mg | Freq: Once | RECTAL | Status: DC

## 2019-03-03 MED ORDER — AMOXICILLIN-POT CLAVULANATE 875-125 MG PO TABS: 1.0000 | ORAL_TABLET | Freq: Two times a day (BID) | ORAL | 0 refills | Status: AC

## 2019-03-03 MED ORDER — ONDANSETRON HCL 4 MG/2ML IJ SOLN
INTRAMUSCULAR | Status: AC
  Filled 2019-03-03: qty 2

## 2019-03-03 MED ORDER — ROCURONIUM BROMIDE 100 MG/10ML IV SOLN
INTRAVENOUS | Status: DC | PRN
  Administered 2019-03-03: 25 mg via INTRAVENOUS
  Administered 2019-03-03: 5 mg via INTRAVENOUS

## 2019-03-03 MED ORDER — LIDOCAINE HCL (CARDIAC) PF 100 MG/5ML IV SOSY
PREFILLED_SYRINGE | INTRAVENOUS | Status: DC | PRN
  Administered 2019-03-03: 100 mg via INTRAVENOUS

## 2019-03-03 MED ORDER — PROPOFOL 10 MG/ML IV BOLUS
INTRAVENOUS | Status: AC
  Filled 2019-03-03: qty 20

## 2019-03-03 MED ORDER — INDOMETHACIN 50 MG RE SUPP
RECTAL | Status: AC
  Filled 2019-03-03: qty 2

## 2019-03-03 MED ORDER — SUCCINYLCHOLINE CHLORIDE 20 MG/ML IJ SOLN
INTRAMUSCULAR | Status: DC | PRN
  Administered 2019-03-03: 100 mg via INTRAVENOUS

## 2019-03-03 MED ORDER — IBUPROFEN 800 MG PO TABS: 800.0000 mg | ORAL_TABLET | Freq: Three times a day (TID) | ORAL | 0 refills | Status: DC | PRN

## 2019-03-03 MED ORDER — FENTANYL CITRATE (PF) 100 MCG/2ML IJ SOLN
INTRAMUSCULAR | Status: AC
  Filled 2019-03-03: qty 2

## 2019-03-03 MED ORDER — ONDANSETRON HCL 4 MG/2ML IJ SOLN
INTRAMUSCULAR | Status: DC | PRN
  Administered 2019-03-03: 4 mg via INTRAVENOUS

## 2019-03-03 MED ORDER — SODIUM CHLORIDE 0.9 % IV SOLN
INTRAVENOUS | Status: DC
  Administered 2019-03-03: 1000 mL via INTRAVENOUS

## 2019-03-03 MED ORDER — ONDANSETRON HCL 4 MG/2ML IJ SOLN: 4.0000 mg | Freq: Once | INTRAMUSCULAR | Status: DC | PRN

## 2019-03-03 MED ORDER — HYDROCODONE-ACETAMINOPHEN 5-325 MG PO TABS: 1.0000 | ORAL_TABLET | Freq: Four times a day (QID) | ORAL | 0 refills | Status: AC | PRN

## 2019-03-03 MED ORDER — ACETAMINOPHEN 325 MG PO TABS: 650.0000 mg | ORAL_TABLET | Freq: Three times a day (TID) | ORAL | 0 refills | Status: AC | PRN

## 2019-03-03 MED ORDER — MIDAZOLAM HCL 2 MG/2ML IJ SOLN
INTRAMUSCULAR | Status: AC
  Filled 2019-03-03: qty 2

## 2019-03-03 MED ORDER — SODIUM CHLORIDE 0.9 % IV SOLN: INTRAVENOUS | Status: DC

## 2019-03-03 MED ORDER — SUCCINYLCHOLINE CHLORIDE 20 MG/ML IJ SOLN
INTRAMUSCULAR | Status: AC
  Filled 2019-03-03: qty 1

## 2019-03-03 MED ORDER — FENTANYL CITRATE (PF) 100 MCG/2ML IJ SOLN
INTRAMUSCULAR | Status: DC | PRN
  Administered 2019-03-03 (×2): 50 ug via INTRAVENOUS

## 2019-03-03 MED ORDER — DOCUSATE SODIUM 100 MG PO CAPS: 100.0000 mg | ORAL_CAPSULE | Freq: Two times a day (BID) | ORAL | 0 refills | Status: AC | PRN

## 2019-03-03 MED ORDER — INDOMETHACIN 50 MG RE SUPP
RECTAL | Status: DC | PRN
  Administered 2019-03-03: 100 mg via RECTAL

## 2019-03-03 MED ORDER — FENTANYL CITRATE (PF) 100 MCG/2ML IJ SOLN: 25.0000 ug | INTRAMUSCULAR | Status: DC | PRN

## 2019-03-03 MED ORDER — PROPOFOL 10 MG/ML IV BOLUS
INTRAVENOUS | Status: DC | PRN
  Administered 2019-03-03: 170 mg via INTRAVENOUS

## 2019-03-03 MED ORDER — ROCURONIUM BROMIDE 50 MG/5ML IV SOLN
INTRAVENOUS | Status: AC
  Filled 2019-03-03: qty 1

## 2019-03-03 MED ORDER — SUGAMMADEX SODIUM 200 MG/2ML IV SOLN
INTRAVENOUS | Status: DC | PRN
  Administered 2019-03-03: 200 mg via INTRAVENOUS

## 2019-03-03 NOTE — Anesthesia Post-op Follow-up Note (Signed)
Anesthesia QCDR form completed.        

## 2019-03-03 NOTE — Op Note (Signed)
[facility] Patient Name: James Kent Procedure Date: 03/03/2019 11:00 AM MRN: [id number] Account #: [id number] Date of Birth: [birth year] Admit Type: Inpatient Age: 53 Room: [facility] ENDO ROOM 4 Gender: Male Note Status: Finalized Procedure:            ERCP Indications:          Bile leak Providers:            Midge Minium MD, MD Referring MD:         No Local Md, MD (Referring MD) Medicines:            General Anesthesia Complications:        No immediate complications. Procedure:            Pre-Anesthesia Assessment:                       - Prior to the procedure, a History and Physical was                        performed, and patient medications and allergies were                        reviewed. The patient's tolerance of previous                        anesthesia was also reviewed. The risks and benefits of                        the procedure and the sedation options and risks were                        discussed with the patient. All questions were                        answered, and informed consent was obtained. Prior                        Anticoagulants: The patient has taken no previous                        anticoagulant or antiplatelet agents. ASA Grade                        Assessment: II - A patient with mild systemic disease.                        After reviewing the risks and benefits, the patient was                        deemed in satisfactory condition to undergo the                        procedure.                       After obtaining informed consent, the scope was passed                        under direct vision. Throughout the procedure, the  patient's blood pressure, pulse, and oxygen saturations                        were monitored continuously. The Duodenoscope was                        introduced through the mouth, and used to inject                        contrast into and used to inject  contrast into the bile                        duct. The ERCP was accomplished without difficulty. The                        patient tolerated the procedure well. Findings:      A scout film of the abdomen was obtained. One percutaneous drain ending       in the Subhepatic space was seen. Surgical clips, consistent with       previous cholecystectomy, were seen in the area of the. The major       papilla was bulging. A wire was passed into the biliary tree. The bile       duct was deeply cannulated with the short-nosed traction sphincterotome.       Contrast was injected. I personally interpreted the bile duct images.       There was brisk flow of contrast through the ducts. Image quality was       excellent. Contrast extended to the entire biliary tree. Extravasation       of contrast originating from the cystic duct was observed. The lower       third of the main bile duct contained one stone. An 8 mm biliary       sphincterotomy was made with a traction (standard) sphincterotome using       ERBE electrocautery. There was no post-sphincterotomy bleeding. The       biliary tree was swept with a 15 mm balloon starting at the bifurcation.       Pus was swept from the duct. One stone was removed. No stones remained.       One 10 Fr by 9 cm pancreatic stent with a single external flap and a       single internal flap was placed 7 cm into the common bile duct. Bile and       pus flowed through the stent. The stent was in good position. Impression:           - The major papilla appeared to be bulging.                       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                       - A bile leak was found.                       - Choledocholithiasis was found. Complete removal was                        accomplished by biliary sphincterotomy and balloon  extraction.                       - A biliary sphincterotomy was performed.                       - The biliary tree was  swept and pus was found.                       - One pancreatic stent was placed into the common bile                        duct. Recommendation:       - Return patient to hospital ward for ongoing care.                       - Clear liquid diet.                       - Repeat ERCP in 3 months to remove stent. Procedure Code(s):    --- Professional ---                       [phone], Endoscopic retrograde cholangiopancreatography                        (ERCP); with placement of endoscopic stent into biliary                        or pancreatic duct, including pre- and post-dilation                        and guide wire passage, when performed, including                        sphincterotomy, when performed, each stent                       43264, Endoscopic retrograde cholangiopancreatography                        (ERCP); with removal of calculi/debris from                        biliary/pancreatic duct(s)                       85631, Endoscopic catheterization of the biliary ductal                        system, radiological supervision and interpretation Diagnosis Code(s):    --- Professional ---                       K83.9, Disease of biliary tract, unspecified                       K80.50, Calculus of bile duct without cholangitis or                        cholecystitis without obstruction CPT copyright 2018 American [employer] Association. All rights reserved. The codes documented in this report are preliminary and upon coder review may  be revised to meet  current compliance requirements. Midge Minium MD, MD 03/03/2019 12:38:29 PM This report has been signed electronically. Number of Addenda: 0 Note Initiated On: 03/03/2019 11:00 AM      [facility]

## 2019-03-03 NOTE — Anesthesia Procedure Notes (Signed)
Procedure Name: Intubation Date/Time: 03/03/2019 11:56 AM Performed by: Lily Kocher, CRNA Pre-anesthesia Checklist: Patient identified, Patient being monitored, Timeout performed, Emergency Drugs available and Suction available Patient Re-evaluated:Patient Re-evaluated prior to induction Oxygen Delivery Method: Circle system utilized Preoxygenation: Pre-oxygenation with 100% oxygen Induction Type: IV induction Ventilation: Mask ventilation without difficulty Laryngoscope Size: McGraph and 4 Grade View: Grade I Tube type: Oral Tube size: 7.0 mm Number of attempts: 1 Airway Equipment and Method: Stylet and Video-laryngoscopy Placement Confirmation: ETT inserted through vocal cords under direct vision,  positive ETCO2 and breath sounds checked- equal and bilateral Secured at: 23 cm Tube secured with: Tape Dental Injury: Teeth and Oropharynx as per pre-operative assessment

## 2019-03-03 NOTE — Discharge Summary (Signed)
Physician Discharge Summary  Patient ID: James Kent MRN: [id number] DOB/AGE: 04-16-1966 53 y.o.  Admit date: 03/01/2019 Discharge date: 03/03/2019  Admission Diagnoses: Intra-abdominal abscess  Discharge Diagnoses:  Same as above, plus bile leak and retained choledocholithiasis status post lap chole  Discharged Condition: good  Hospital Course: Patient returned to the hospital postop lap chole for increasing pain in the right upper quadrant.  Repeat MRCP was negative for choledocholithiasis but showed a fluid collection around the gallbladder fossa.  LFTs also were elevated at this time.  IR was consulted for CT-guided percutaneous drainage placement for the new fluid collection.  GI was then consulted for further management of possible bile leak.  ERCP was performed and noted to have a bile leak from the cystic duct stump, along with retained choledocholithiasis which was successfully removed.  Postprocedure the IR drain was only showing serous drainage, and patient states he is feeling much better since the procedure.   The typical course is overnight observation after ERCP and repeat blood work to ensure that they are improving, along with close monitoring for possible postprocedure complications such as pancreatitis.  I did highly urge the patient to stay overnight so that we can continue to monitor for such, but patient was adamant that he needs to take care of issues outside of the hospital and requested to be discharged today.  We made a compromise by asking the patient to come in for outpatient labs first thing in the morning the day after discharge, and to keep open communication for any signs of worsening symptoms.    Patient verbalized understanding and was agreeable to this plan.  Due to the increased leukocytosis along with visible purulent discharge within the duct system during ERCP, will be sent home on additional 7-day course of Augmentin.  At the time of discharge, patient was  tolerating clear liquid diet with minimal amounts of pain, and the JP was noted only to have minimal serous drainage.  Consults: GI and IR  Discharge Exam: Blood pressure (!) 164/88, pulse 80, temperature 98.2 F (36.8 C), resp. rate 18, height 5\' 7"  (1.702 m), weight 99.8 kg, SpO2 96 %. General appearance: alert, cooperative and icteric GI: soft, non-tender; bowel sounds normal; no masses,  no organomegaly Incision/Wound: port sites c/d/i.  drain with serous drainage, dressing intact  Disposition:  Discharge disposition: 01-Home or Self Care       Discharge Instructions    Discharge patient   Complete by:  As directed    Discharge disposition:  01-Home or Self Care   Discharge patient date:  03/03/2019     Allergies as of 03/03/2019   No Known Allergies     Medication List    TAKE these medications   acetaminophen 325 MG tablet Commonly known as:  Tylenol Take 2 tablets (650 mg total) by mouth every 8 (eight) hours as needed for up to 30 days for mild pain.   amoxicillin-clavulanate 875-125 MG tablet Commonly known as:  Augmentin Take 1 tablet by mouth 2 (two) times daily for 7 days.   atorvastatin 40 MG tablet Commonly known as:  LIPITOR Take 40 mg by mouth daily.   docusate sodium 100 MG capsule Commonly known as:  Colace Take 1 capsule (100 mg total) by mouth 2 (two) times daily as needed for up to 10 days for mild constipation.   fenofibrate 160 MG tablet Take 160 mg by mouth daily.   fluticasone furoate-vilanterol 100-25 MCG/INH Aepb Commonly known as:  BREO ELLIPTA  Inhale 1 puff into the lungs daily.   gabapentin 300 MG capsule Commonly known as:  NEURONTIN Take 300 mg by mouth daily.   glipiZIDE 5 MG tablet Commonly known as:  GLUCOTROL Take 5 mg by mouth daily.   HYDROcodone-acetaminophen 5-325 MG tablet Commonly known as:  Norco Take 1 tablet by mouth every 6 (six) hours as needed for up to 3 days for moderate pain.   ibuprofen 800 MG  tablet Commonly known as:  ADVIL,MOTRIN Take 1 tablet (800 mg total) by mouth every 8 (eight) hours as needed for mild pain or moderate pain.   levothyroxine 200 MCG tablet Commonly known as:  SYNTHROID, LEVOTHROID Take 200 mcg by mouth daily.   lisinopril 40 MG tablet Commonly known as:  PRINIVIL,ZESTRIL Take 40 mg by mouth daily.   metFORMIN 1000 MG tablet Commonly known as:  GLUCOPHAGE Take 1,000 mg by mouth 2 (two) times daily.   omeprazole 20 MG capsule Commonly known as:  PRILOSEC Take 20 mg by mouth daily.      Follow-up Information    Hesston, Kyrell Ruacho, DO. Go on 03/06/2019.   Specialty:  Surgery Why:  @8am  Contact information: [address] [place] [address] [phone]            Total time spent arranging discharge was >58min. Signed: Sung Amabile 03/03/2019, 3:01 PM

## 2019-03-03 NOTE — Anesthesia Postprocedure Evaluation (Signed)
Anesthesia Post Note  Patient: James Kent  Procedure(s) Performed: ENDOSCOPIC RETROGRADE CHOLANGIOPANCREATOGRAPHY (ERCP) (N/A )  Patient location during evaluation: PACU Anesthesia Type: General Level of consciousness: awake and alert Pain management: pain level controlled Vital Signs Assessment: post-procedure vital signs reviewed and stable Respiratory status: spontaneous breathing and respiratory function stable Cardiovascular status: stable Anesthetic complications: no     Last Vitals:  Vitals:   03/03/19 1318 03/03/19 1328  BP: (!) 151/94 (!) 164/88  Pulse: 74 80  Resp: 15 18  Temp:  36.8 C  SpO2: 96% 96%    Last Pain:  Vitals:   03/03/19 1328  TempSrc:   PainSc: 0-No pain                 KEPHART,Knut K

## 2019-03-03 NOTE — Anesthesia Preprocedure Evaluation (Signed)
Anesthesia Evaluation  Patient identified by MRN, date of birth, ID band Patient awake    Reviewed: Allergy & Precautions, NPO status , Patient's Chart, lab work & pertinent test results  History of Anesthesia Complications Negative for: history of anesthetic complications  Airway Mallampati: II       Dental   Pulmonary asthma , neg sleep apnea, neg COPD, Current Smoker,           Cardiovascular hypertension, Pt. on medications (-) Past MI and (-) CHF (-) dysrhythmias (-) Valvular Problems/Murmurs     Neuro/Psych neg Seizures    GI/Hepatic Neg liver ROS, GERD  Medicated and Controlled,  Endo/Other  diabetes, Type 2, Oral Hypoglycemic Agents  Renal/GU negative Renal ROS     Musculoskeletal   Abdominal   Peds  Hematology   Anesthesia Other Findings   Reproductive/Obstetrics                             Anesthesia Physical Anesthesia Plan  ASA: III  Anesthesia Plan: General   Post-op Pain Management:    Induction: Intravenous  PONV Risk Score and Plan: 1 and Ondansetron  Airway Management Planned: Oral ETT  Additional Equipment:   Intra-op Plan:   Post-operative Plan:   Informed Consent: I have reviewed the patients History and Physical, chart, labs and discussed the procedure including the risks, benefits and alternatives for the proposed anesthesia with the patient or authorized representative who has indicated his/her understanding and acceptance.       Plan Discussed with:   Anesthesia Plan Comments:         Anesthesia Quick Evaluation

## 2019-03-03 NOTE — Discharge Instructions (Signed)
-  Please keep record of drain output and type of output.  Ok to shower today and replace dressing around drain daily as needed.  -Please obtain labwork at the [facility] clinic lab on the 1st floor tomorrow am (03/04/2019) to continue to monitor abnormal bilirubin and liver enzyme levels.  - Call office during normal business hours for sudden increase in pain, discharge, N/V,  and/or accompanying fever.  Go to nearest urgent care/ED for afterhour care.

## 2019-03-03 NOTE — Transfer of Care (Signed)
Immediate Anesthesia Transfer of Care Note  Patient: James Kent  Procedure(s) Performed: ENDOSCOPIC RETROGRADE CHOLANGIOPANCREATOGRAPHY (ERCP) (N/A )  Patient Location: PACU  Anesthesia Type:General  Level of Consciousness: awake and patient cooperative  Airway & Oxygen Therapy: Patient Spontanous Breathing and Patient connected to face mask oxygen  Post-op Assessment: Report given to RN and Post -op Vital signs reviewed and stable  Post vital signs: Reviewed and stable  Last Vitals:  Vitals Value Taken Time  BP 143/91 03/03/2019 12:48 PM  Temp 36.8 C 03/03/2019 12:48 PM  Pulse 80 03/03/2019 12:48 PM  Resp 21 03/03/2019 12:48 PM  SpO2 100 % 03/03/2019 12:48 PM    Last Pain:  Vitals:   03/03/19 1248  TempSrc:   PainSc: 0-No pain      Patients Stated Pain Goal: 0 (03/03/19 1055)  Complications: No apparent anesthesia complications

## 2019-03-04 SURGERY — ENDOSCOPIC RETROGRADE CHOLANGIOPANCREATOGRAPHY (ERCP) WITH PROPOFOL
Anesthesia: General

## 2019-03-06 ENCOUNTER — Encounter: Payer: Self-pay | Admitting: Gastroenterology

## 2019-03-07 LAB — AEROBIC/ANAEROBIC CULTURE W GRAM STAIN (SURGICAL/DEEP WOUND): Culture: NO GROWTH

## 2019-03-07 LAB — AEROBIC/ANAEROBIC CULTURE (SURGICAL/DEEP WOUND)

## 2019-03-09 ENCOUNTER — Other Ambulatory Visit: Payer: Self-pay

## 2019-03-09 DIAGNOSIS — Z4659 Encounter for fitting and adjustment of other gastrointestinal appliance and device: Secondary | ICD-10-CM

## 2019-03-10 ENCOUNTER — Telehealth: Payer: Self-pay | Admitting: Gastroenterology

## 2019-03-10 NOTE — Telephone Encounter (Signed)
Pt is calling for James Kent regarding ERCP apt( Stint removal)

## 2019-03-11 NOTE — Telephone Encounter (Signed)
Pt has been scheduled for ERCP stent removal for 06/02/19.  Dr Servando Snare, should we order a hepatic function to recheck his liver enzymes? Last ones were done 8 day ago.

## 2019-03-12 ENCOUNTER — Other Ambulatory Visit: Payer: Self-pay

## 2019-03-12 DIAGNOSIS — K838 Other specified diseases of biliary tract: Secondary | ICD-10-CM

## 2019-03-12 DIAGNOSIS — K9189 Other postprocedural complications and disorders of digestive system: Principal | ICD-10-CM

## 2019-03-12 NOTE — Telephone Encounter (Signed)
Left vm letting pt know I have ordered LFT's to recheck his liver enzymes again to make sure they are still getting better.

## 2019-03-12 NOTE — Telephone Encounter (Signed)
Does he not have a follow up with surgery. This was a post lap Chole duct leak and they should be checking on that. If they are not then we can.

## 2019-03-17 ENCOUNTER — Other Ambulatory Visit
Admission: RE | Admit: 2019-03-17 | Discharge: 2019-03-17 | Disposition: A | Discharge status: Home / Self Care | Payer: Medicaid - Out of State | Source: Ambulatory Visit | Attending: Gastroenterology | Admitting: Gastroenterology

## 2019-03-17 ENCOUNTER — Telehealth: Payer: Self-pay

## 2019-03-17 DIAGNOSIS — K838 Other specified diseases of biliary tract: Secondary | ICD-10-CM | POA: Insufficient documentation

## 2019-03-17 DIAGNOSIS — K9189 Other postprocedural complications and disorders of digestive system: Secondary | ICD-10-CM | POA: Insufficient documentation

## 2019-03-17 LAB — HEPATIC FUNCTION PANEL
ALT: 37 U/L (ref 0–44)
AST: 31 U/L (ref 15–41)
Albumin: 4.3 g/dL (ref 3.5–5.0)
Alkaline Phosphatase: 148 U/L — ABNORMAL HIGH (ref 38–126)
Bilirubin, Direct: 0.4 mg/dL — ABNORMAL HIGH (ref 0.0–0.2)
Indirect Bilirubin: 0.9 mg/dL (ref 0.3–0.9)
Total Bilirubin: 1.3 mg/dL — ABNORMAL HIGH (ref 0.3–1.2)
Total Protein: 8 g/dL (ref 6.5–8.1)

## 2019-03-17 NOTE — Telephone Encounter (Signed)
-----   Message from Midge Minium, MD sent at 03/17/2019  2:10 PM EDT ----- Let the patient know that his liver enzymes are greatly improved.

## 2019-03-17 NOTE — Telephone Encounter (Signed)
Left vm regarding lab results 

## 2019-04-14 ENCOUNTER — Ambulatory Visit: Payer: PRIVATE HEALTH INSURANCE | Admitting: Gastroenterology

## 2019-05-25 ENCOUNTER — Other Ambulatory Visit: Payer: Self-pay

## 2019-05-25 DIAGNOSIS — Z01812 Encounter for preprocedural laboratory examination: Secondary | ICD-10-CM

## 2019-05-29 ENCOUNTER — Other Ambulatory Visit
Admission: RE | Admit: 2019-05-29 | Discharge: 2019-05-29 | Disposition: A | Discharge status: Home / Self Care | Payer: Medicaid - Out of State | Source: Ambulatory Visit | Attending: Gastroenterology | Admitting: Gastroenterology

## 2019-05-29 ENCOUNTER — Other Ambulatory Visit: Payer: Self-pay

## 2019-05-29 DIAGNOSIS — Z1159 Encounter for screening for other viral diseases: Secondary | ICD-10-CM | POA: Diagnosis present

## 2019-05-30 LAB — NOVEL CORONAVIRUS, NAA (HOSP ORDER, SEND-OUT TO REF LAB; TAT 18-24 HRS): SARS-CoV-2, NAA: NOT DETECTED

## 2019-06-02 ENCOUNTER — Ambulatory Visit: Payer: Medicaid - Out of State | Admitting: Anesthesiology

## 2019-06-02 ENCOUNTER — Ambulatory Visit: Payer: Medicaid - Out of State

## 2019-06-02 ENCOUNTER — Encounter
Admission: RE | Disposition: A | Discharge status: Home / Self Care | Payer: Self-pay | Source: Home / Self Care | Attending: Gastroenterology

## 2019-06-02 ENCOUNTER — Ambulatory Visit
Admission: RE | Admit: 2019-06-02 | Discharge: 2019-06-02 | Disposition: A | Discharge status: Home / Self Care | Payer: Medicaid - Out of State | Attending: Gastroenterology | Admitting: Gastroenterology

## 2019-06-02 ENCOUNTER — Encounter: Payer: Self-pay | Admitting: Anesthesiology

## 2019-06-02 DIAGNOSIS — K838 Other specified diseases of biliary tract: Secondary | ICD-10-CM

## 2019-06-02 DIAGNOSIS — E119 Type 2 diabetes mellitus without complications: Secondary | ICD-10-CM | POA: Insufficient documentation

## 2019-06-02 DIAGNOSIS — Z7984 Long term (current) use of oral hypoglycemic drugs: Secondary | ICD-10-CM | POA: Insufficient documentation

## 2019-06-02 DIAGNOSIS — Z79899 Other long term (current) drug therapy: Secondary | ICD-10-CM | POA: Diagnosis not present

## 2019-06-02 DIAGNOSIS — Z7951 Long term (current) use of inhaled steroids: Secondary | ICD-10-CM | POA: Insufficient documentation

## 2019-06-02 DIAGNOSIS — F17210 Nicotine dependence, cigarettes, uncomplicated: Secondary | ICD-10-CM | POA: Insufficient documentation

## 2019-06-02 DIAGNOSIS — Z8719 Personal history of other diseases of the digestive system: Secondary | ICD-10-CM | POA: Insufficient documentation

## 2019-06-02 DIAGNOSIS — E079 Disorder of thyroid, unspecified: Secondary | ICD-10-CM | POA: Diagnosis not present

## 2019-06-02 DIAGNOSIS — Z9049 Acquired absence of other specified parts of digestive tract: Secondary | ICD-10-CM | POA: Diagnosis not present

## 2019-06-02 DIAGNOSIS — Z4659 Encounter for fitting and adjustment of other gastrointestinal appliance and device: Secondary | ICD-10-CM | POA: Diagnosis not present

## 2019-06-02 DIAGNOSIS — I10 Essential (primary) hypertension: Secondary | ICD-10-CM | POA: Insufficient documentation

## 2019-06-02 DIAGNOSIS — J45909 Unspecified asthma, uncomplicated: Secondary | ICD-10-CM | POA: Diagnosis not present

## 2019-06-02 DIAGNOSIS — Z791 Long term (current) use of non-steroidal anti-inflammatories (NSAID): Secondary | ICD-10-CM | POA: Insufficient documentation

## 2019-06-02 HISTORY — PX: ERCP: SHX5425

## 2019-06-02 LAB — GLUCOSE, CAPILLARY: Glucose-Capillary: 155 mg/dL — ABNORMAL HIGH (ref 70–99)

## 2019-06-02 SURGERY — ERCP, WITH INTERVENTION IF INDICATED
Anesthesia: General

## 2019-06-02 MED ORDER — LIDOCAINE HCL (CARDIAC) PF 100 MG/5ML IV SOSY
PREFILLED_SYRINGE | INTRAVENOUS | Status: DC | PRN
  Administered 2019-06-02: 100 mg via INTRAVENOUS

## 2019-06-02 MED ORDER — SODIUM CHLORIDE 0.9 % IV SOLN
INTRAVENOUS | Status: DC
  Administered 2019-06-02: 1000 mL via INTRAVENOUS

## 2019-06-02 MED ORDER — LACTATED RINGERS IV SOLN
INTRAVENOUS | Status: DC | PRN
  Administered 2019-06-02: 15:00:00 via INTRAVENOUS

## 2019-06-02 MED ORDER — PROPOFOL 10 MG/ML IV BOLUS
INTRAVENOUS | Status: DC | PRN
  Administered 2019-06-02: 30 mg via INTRAVENOUS
  Administered 2019-06-02: 40 mg via INTRAVENOUS
  Administered 2019-06-02: 60 mg via INTRAVENOUS

## 2019-06-02 MED ORDER — PROPOFOL 500 MG/50ML IV EMUL
INTRAVENOUS | Status: DC | PRN
  Administered 2019-06-02: 150 ug/kg/min via INTRAVENOUS

## 2019-06-02 NOTE — Anesthesia Postprocedure Evaluation (Signed)
Anesthesia Post Note  Patient: James Kent  Procedure(s) Performed: ENDOSCOPIC RETROGRADE CHOLANGIOPANCREATOGRAPHY (ERCP) (N/A )  Patient location during evaluation: Endoscopy Anesthesia Type: General Level of consciousness: awake and alert Pain management: pain level controlled Vital Signs Assessment: post-procedure vital signs reviewed and stable Respiratory status: spontaneous breathing, nonlabored ventilation, respiratory function stable and patient connected to nasal cannula oxygen Cardiovascular status: blood pressure returned to baseline and stable Postop Assessment: no apparent nausea or vomiting Anesthetic complications: no     Last Vitals:  Vitals:   06/02/19 1559 06/02/19 1609  BP: 140/90 (!) 133/94  Pulse: 72 71  Resp: 19 13  Temp:    SpO2: 99% 99%    Last Pain:  Vitals:   06/02/19 1609  TempSrc:   PainSc: 0-No pain                 Martha Clan

## 2019-06-02 NOTE — Anesthesia Procedure Notes (Signed)
Date/Time: 06/02/2019 3:13 PM Performed by: Caryl Asp, CRNA Oxygen Delivery Method: Nasal cannula

## 2019-06-02 NOTE — Op Note (Signed)
[facility] Gastroenterology Patient Name: James PearsonWilliam Fick Procedure Date: 06/02/2019 3:11 PM MRN: [id number] Account #: [id number] Date of Birth: 05/08/1966 Admit Type: Outpatient Age: [age] [facility] Gender: Male Note Status: Finalized Procedure:            ERCP Indications:          Follow-up of bile leak Providers:            Midge Miniumarren Swati Granberry MD, MD Medicines:            Propofol per Anesthesia Complications:        No immediate complications. Procedure:            Pre-Anesthesia Assessment:                       - Prior to the procedure, a History and Physical was                        performed, and patient medications and allergies were                        reviewed. The patient's tolerance of previous                        anesthesia was also reviewed. The risks and benefits of                        the procedure and the sedation options and risks were                        discussed with the patient. All questions were                        answered, and informed consent was obtained. Prior                        Anticoagulants: The patient has taken no previous                        anticoagulant or antiplatelet agents. ASA Grade                        Assessment: II - A patient with mild systemic disease.                        After reviewing the risks and benefits, the patient was                        deemed in satisfactory condition to undergo the                        procedure.                       After obtaining informed consent, the scope was passed                        under direct vision. Throughout the procedure, the                        patient's blood pressure, pulse,  and oxygen saturations                        were monitored continuously. The Duodenoscope was                        introduced through the mouth, and used to inject                        contrast into and used to cannulate the bile duct. The             ERCP was accomplished without difficulty. The patient                        tolerated the procedure well. Findings:      A biliary stent was visible on the scout film. One stent originating in       the common bile duct was emerging from the major papilla. A wire was       passed into the biliary tree. One stent was removed from the common bile       duct using a snare. The bile duct was deeply cannulated. Contrast was       injected. I personally interpreted the bile duct images. There was brisk       flow of contrast through the ducts. Image quality was excellent.       Contrast extended to the entire biliary tree. The main bile duct was       normal. Impression:           - One stent from the common bile duct was seen in the                        major papilla.                       - One stent was removed from the common bile duct.                       - No further leak Recommendation:       - Resume previous diet.                       - Continue present medications.                       - Watch for pancreatitis, bleeding, perforation, and                        cholangitis. Procedure Code(s):    --- Professional ---                       [phone], Endoscopic retrograde cholangiopancreatography                        (ERCP); with removal of foreign body(s) or stent(s)                        from biliary/pancreatic duct(s)                       [id number], Endoscopic catheterization of the biliary ductal  system, radiological supervision and interpretation Diagnosis Code(s):    --- Professional ---                       K83.8, Other specified diseases of biliary tract                       Z46.59, Encounter for fitting and adjustment of other                        gastrointestinal appliance and device CPT copyright 2019 American Medical Association. All rights reserved. The codes documented in this report are preliminary and upon coder review may  be  revised to meet current compliance requirements. Lucilla Lame MD, MD 06/02/2019 3:36:31 PM This report has been signed electronically. Number of Addenda: 0 Note Initiated On: 06/02/2019 3:11 PM Estimated Blood Loss: Estimated blood loss: none.      [facility]

## 2019-06-02 NOTE — H&P (Signed)
Lucilla Lame, MD [address] [place], [place] [address] [phone] Fax : [phone]  Primary Care Physician:  Patient, No Pcp Per Primary Gastroenterologist:  Dr. Allen Norris  Pre-Procedure History & Physical: HPI:  Brydon Spahr is a 53 y.o. male is here for an ERCP.   Past Medical History:  Diagnosis Date  . Asthma   . Diabetes mellitus without complication ([place])   . Hypertension   . Thyroid disease     Past Surgical History:  Procedure Laterality Date  . CARPAL TUNNEL RELEASE    . CHOLECYSTECTOMY N/A 02/21/2019   Procedure: LAPAROSCOPIC CHOLECYSTECTOMY;  Surgeon: Benjamine Sprague, DO;  Location: [facility] ORS;  Service: General;  Laterality: N/A;  . ERCP N/A 03/03/2019   Procedure: ENDOSCOPIC RETROGRADE CHOLANGIOPANCREATOGRAPHY (ERCP);  Surgeon: Lucilla Lame, MD;  Location: [facility] ENDOSCOPY;  Service: Endoscopy;  Laterality: N/A;  . knees Bilateral     Prior to Admission medications   Medication Sig Start Date End Date Taking? Authorizing Provider  fenofibrate 160 MG tablet Take 160 mg by mouth daily.   Yes [provider]  fluticasone furoate-vilanterol (BREO ELLIPTA) 100-25 MCG/INH AEPB Inhale 1 puff into the lungs daily.   Yes [provider]  gabapentin (NEURONTIN) 300 MG capsule Take 300 mg by mouth daily.   Yes [provider]  ibuprofen (ADVIL,MOTRIN) 800 MG tablet Take 1 tablet (800 mg total) by mouth every 8 (eight) hours as needed for mild pain or moderate pain. 03/03/19  Yes Sakai, Isami, DO  levothyroxine (SYNTHROID, LEVOTHROID) 200 MCG tablet Take 200 mcg by mouth daily.   Yes [provider]  lisinopril (PRINIVIL,ZESTRIL) 40 MG tablet Take 40 mg by mouth daily.   Yes [provider]  metFORMIN (GLUCOPHAGE) 1000 MG tablet Take 1,000 mg by mouth 2 (two) times daily.   Yes [provider]  atorvastatin (LIPITOR) 40 MG tablet Take 40 mg by mouth daily.    [provider]  glipiZIDE (GLUCOTROL) 5  MG tablet Take 5 mg by mouth daily.    [provider]  omeprazole (PRILOSEC) 20 MG capsule Take 20 mg by mouth daily.    [provider]    Allergies as of 03/09/2019  . (No Known Allergies)    History reviewed. No pertinent family history.  Social History   Socioeconomic History  . Marital status: Single    Spouse name: Not on file  . Number of children: Not on file  . Years of education: Not on file  . Highest education level: Not on file  Occupational History  . Not on file  Social Needs  . Financial resource strain: Not on file  . Food insecurity    Worry: Not on file    Inability: Not on file  . Transportation needs    Medical: Not on file    Non-medical: Not on file  Tobacco Use  . Smoking status: Current Every Day Smoker    Packs/day: 0.50    Types: Cigarettes  . Smokeless tobacco: Never Used  Substance and Sexual Activity  . Alcohol use: Never    Frequency: Never  . Drug use: Never  . Sexual activity: Not on file  Lifestyle  . Physical activity    Days per week: Not on file    Minutes per session: Not on file  . Stress: Not on file  Relationships  . Social Herbalist on phone: Not on file    Gets together: Not on file  Attends religious service: Not on file    Active member of club or organization: Not on file    Attends meetings of clubs or organizations: Not on file    Relationship status: Not on file  . Intimate partner violence    Fear of current or ex partner: Not on file    Emotionally abused: Not on file    Physically abused: Not on file    Forced sexual activity: Not on file  Other Topics Concern  . Not on file  Social History Narrative  . Not on file    Review of Systems: See HPI, otherwise negative ROS  Physical Exam: BP 135/89   Pulse 89   Temp (!) 97.3 F (36.3 C) (Tympanic)   Resp 20   Ht 5\' 8"  (1.727 m)   Wt 95.7 kg   SpO2 96%   BMI 32.08 kg/m  General:   Alert,  pleasant and cooperative  in NAD Head:  Normocephalic and atraumatic. Neck:  Supple; no masses or thyromegaly. Lungs:  Clear throughout to auscultation.    Heart:  Regular rate and rhythm. Abdomen:  Soft, nontender and nondistended. Normal bowel sounds, without guarding, and without rebound.   Neurologic:  Alert and  oriented x4;  grossly normal neurologically.  Impression/Plan: Tonye PearsonWilliam Wahlen is here for an ERCP to be performed for stent removal  Risks, benefits, limitations, and alternatives regarding  ERCP have been reviewed with the patient.  Questions have been answered.  All parties agreeable.   Midge Miniumarren Lizet Kelso, MD  06/02/2019, 3:37 PM

## 2019-06-02 NOTE — Anesthesia Post-op Follow-up Note (Signed)
Anesthesia QCDR form completed.        

## 2019-06-02 NOTE — Transfer of Care (Signed)
Immediate Anesthesia Transfer of Care Note  Patient: James Kent  Procedure(s) Performed: ENDOSCOPIC RETROGRADE CHOLANGIOPANCREATOGRAPHY (ERCP) (N/A )  Patient Location: PACU  Anesthesia Type:General  Level of Consciousness: awake, alert  and oriented  Airway & Oxygen Therapy: Patient Spontanous Breathing and Patient connected to nasal cannula oxygen  Post-op Assessment: Report given to RN, Post -op Vital signs reviewed and stable and Patient moving all extremities X 4  Post vital signs: Reviewed and stable  Last Vitals:  Vitals Value Taken Time  BP 123/94 06/02/19 1539  Temp 36 C 06/02/19 1539  Pulse 75 06/02/19 1543  Resp 14 06/02/19 1543  SpO2 97 % 06/02/19 1543  Vitals shown include unvalidated device data.  Last Pain:  Vitals:   06/02/19 1539  TempSrc: Tympanic  PainSc: 0-No pain      Patients Stated Pain Goal: 0 (49/75/30 0511)  Complications: No apparent anesthesia complications

## 2019-06-02 NOTE — Anesthesia Preprocedure Evaluation (Signed)
Anesthesia Evaluation  Patient identified by MRN, date of birth, ID band Patient awake    Reviewed: Allergy & Precautions, NPO status , Patient's Chart, lab work & pertinent test results, reviewed documented beta blocker date and time   Airway Mallampati: II  TM Distance: >3 FB     Dental  (+) Chipped   Pulmonary asthma , Current Smoker,           Cardiovascular hypertension, Pt. on medications      Neuro/Psych    GI/Hepatic   Endo/Other  diabetes, Type 2  Renal/GU      Musculoskeletal   Abdominal   Peds  Hematology   Anesthesia Other Findings EKG ok.  Reproductive/Obstetrics                             Anesthesia Physical Anesthesia Plan  ASA: II  Anesthesia Plan: General   Post-op Pain Management:    Induction: Intravenous  PONV Risk Score and Plan:   Airway Management Planned:   Additional Equipment:   Intra-op Plan:   Post-operative Plan:   Informed Consent: I have reviewed the patients History and Physical, chart, labs and discussed the procedure including the risks, benefits and alternatives for the proposed anesthesia with the patient or authorized representative who has indicated his/her understanding and acceptance.       Plan Discussed with: CRNA  Anesthesia Plan Comments:         Anesthesia Quick Evaluation

## 2019-06-03 ENCOUNTER — Telehealth: Payer: Self-pay | Admitting: Gastroenterology

## 2019-06-03 ENCOUNTER — Encounter: Payer: Self-pay | Admitting: Gastroenterology

## 2019-06-03 NOTE — Telephone Encounter (Signed)
Rita  Team lead from [facility] [facility] left vm  She states this is the 2nd  Message she has left, pt had surgery with Dr. Allen Norris yesterday and states pt insurance required Pre-Authorization  She states her call was not returned  She needs a call  ASAP  [phone] ext [phone]  She will be in office until 5 pm today

## 2019-10-01 ENCOUNTER — Other Ambulatory Visit: Payer: Self-pay

## 2019-10-02 ENCOUNTER — Ambulatory Visit (INDEPENDENT_AMBULATORY_CARE_PROVIDER_SITE_OTHER): Payer: Medicare Other | Admitting: Nurse Practitioner

## 2019-10-02 ENCOUNTER — Encounter: Payer: Self-pay | Admitting: Nurse Practitioner

## 2019-10-02 VITALS — BP 135/87 | HR 87 | Temp 98.9°F | Ht 67.2 in | Wt 218.4 lb

## 2019-10-02 DIAGNOSIS — E119 Type 2 diabetes mellitus without complications: Secondary | ICD-10-CM | POA: Diagnosis not present

## 2019-10-02 DIAGNOSIS — H42 Glaucoma in diseases classified elsewhere: Secondary | ICD-10-CM

## 2019-10-02 DIAGNOSIS — I10 Essential (primary) hypertension: Secondary | ICD-10-CM | POA: Diagnosis not present

## 2019-10-02 DIAGNOSIS — F17219 Nicotine dependence, cigarettes, with unspecified nicotine-induced disorders: Secondary | ICD-10-CM | POA: Insufficient documentation

## 2019-10-02 DIAGNOSIS — E785 Hyperlipidemia, unspecified: Secondary | ICD-10-CM

## 2019-10-02 DIAGNOSIS — E039 Hypothyroidism, unspecified: Secondary | ICD-10-CM | POA: Insufficient documentation

## 2019-10-02 DIAGNOSIS — E1139 Type 2 diabetes mellitus with other diabetic ophthalmic complication: Secondary | ICD-10-CM

## 2019-10-02 DIAGNOSIS — E1169 Type 2 diabetes mellitus with other specified complication: Secondary | ICD-10-CM | POA: Insufficient documentation

## 2019-10-02 MED ORDER — ATORVASTATIN CALCIUM 40 MG PO TABS: 40.0000 mg | ORAL_TABLET | Freq: Every day | ORAL | 3 refills | Status: AC

## 2019-10-02 MED ORDER — LISINOPRIL 20 MG PO TABS: 20.0000 mg | ORAL_TABLET | Freq: Every day | ORAL | 3 refills | Status: AC

## 2019-10-02 MED ORDER — LEVOTHYROXINE SODIUM 200 MCG PO TABS: 200.0000 ug | ORAL_TABLET | Freq: Every day | ORAL | 3 refills | Status: DC

## 2019-10-02 MED ORDER — METFORMIN HCL 1000 MG PO TABS: 1000.0000 mg | ORAL_TABLET | Freq: Two times a day (BID) | ORAL | 3 refills | Status: DC

## 2019-10-02 MED ORDER — FENOFIBRATE 160 MG PO TABS: 160.0000 mg | ORAL_TABLET | Freq: Every day | ORAL | 3 refills | Status: AC

## 2019-10-02 NOTE — Assessment & Plan Note (Signed)
Chronic, ongoing.  Continue current medication regimen.  Referral to local ophthalmology to establish care, Dr. Ellin Mayhew.

## 2019-10-02 NOTE — Assessment & Plan Note (Signed)
Chronic, ongoing.  BP at goal today.  Continue current medication regimen and adjust as needed, Lisinopril for kidney protection.  Obtain labs today.  Return in 6 weeks.

## 2019-10-02 NOTE — Assessment & Plan Note (Signed)
I have recommended complete cessation of tobacco use. I have discussed various options available for assistance with tobacco cessation including over the counter methods (Nicotine gum, patch and lozenges). We also discussed prescription options (Chantix, Nicotine Inhaler / Nasal Spray). The patient is not interested in pursuing any prescription tobacco cessation options at this time.  

## 2019-10-02 NOTE — Assessment & Plan Note (Signed)
Chronic, ongoing.  Continue current medication regimen and adjust as needed.  Thyroid panel today.  Educated to take medication on own in morning, at least 30 minutes prior to other medications and meal.

## 2019-10-02 NOTE — Patient Instructions (Signed)
Carbohydrate Counting for Diabetes Mellitus, Adult  Carbohydrate counting is a method of keeping track of how many carbohydrates you eat. Eating carbohydrates naturally increases the amount of sugar (glucose) in the blood. Counting how many carbohydrates you eat helps keep your blood glucose within normal limits, which helps you manage your diabetes (diabetes mellitus). It is important to know how many carbohydrates you can safely have in each meal. This is different for every person. A diet and nutrition specialist (registered dietitian) can help you make a meal plan and calculate how many carbohydrates you should have at each meal and snack. Carbohydrates are found in the following foods:  Grains, such as breads and cereals.  Dried beans and soy products.  Starchy vegetables, such as potatoes, peas, and corn.  Fruit and fruit juices.  Milk and yogurt.  Sweets and snack foods, such as cake, cookies, candy, chips, and soft drinks. How do I count carbohydrates? There are two ways to count carbohydrates in food. You can use either of the methods or a combination of both. Reading "Nutrition Facts" on packaged food The "Nutrition Facts" list is included on the labels of almost all packaged foods and beverages in the [place] It includes:  The serving size.  Information about nutrients in each serving, including the grams (g) of carbohydrate per serving. To use the "Nutrition Facts":  Decide how many servings you will have.  Multiply the number of servings by the number of carbohydrates per serving.  The resulting number is the total amount of carbohydrates that you will be having. Learning standard serving sizes of other foods When you eat carbohydrate foods that are not packaged or do not include "Nutrition Facts" on the label, you need to measure the servings in order to count the amount of carbohydrates:  Measure the foods that you will eat with a food scale or measuring cup, if needed.   Decide how many standard-size servings you will eat.  Multiply the number of servings by 15. Most carbohydrate-rich foods have about 15 g of carbohydrates per serving. ? For example, if you eat 8 oz (170 g) of strawberries, you will have eaten 2 servings and 30 g of carbohydrates (2 servings x 15 g = 30 g).  For foods that have more than one food mixed, such as soups and casseroles, you must count the carbohydrates in each food that is included. The following list contains standard serving sizes of common carbohydrate-rich foods. Each of these servings has about 15 g of carbohydrates:   hamburger bun or  English muffin.   oz (15 mL) syrup.   oz (14 g) jelly.  1 slice of bread.  1 six-inch tortilla.  3 oz (85 g) cooked rice or pasta.  4 oz (113 g) cooked dried beans.  4 oz (113 g) starchy vegetable, such as peas, corn, or potatoes.  4 oz (113 g) hot cereal.  4 oz (113 g) mashed potatoes or  of a large baked potato.  4 oz (113 g) canned or frozen fruit.  4 oz (120 mL) fruit juice.  4-6 crackers.  6 chicken nuggets.  6 oz (170 g) unsweetened dry cereal.  6 oz (170 g) plain fat-free yogurt or yogurt sweetened with artificial sweeteners.  8 oz (240 mL) milk.  8 oz (170 g) fresh fruit or one small piece of fruit.  24 oz (680 g) popped popcorn. Example of carbohydrate counting Sample meal  3 oz (85 g) chicken breast.  6 oz (170 g)   brown rice.  4 oz (113 g) corn.  8 oz (240 mL) milk.  8 oz (170 g) strawberries with sugar-free whipped topping. Carbohydrate calculation 1. Identify the foods that contain carbohydrates: ? Rice. ? Corn. ? Milk. ? Strawberries. 2. Calculate how many servings you have of each food: ? 2 servings rice. ? 1 serving corn. ? 1 serving milk. ? 1 serving strawberries. 3. Multiply each number of servings by 15 g: ? 2 servings rice x 15 g = 30 g. ? 1 serving corn x 15 g = 15 g. ? 1 serving milk x 15 g = 15 g. ? 1 serving  strawberries x 15 g = 15 g. 4. Add together all of the amounts to find the total grams of carbohydrates eaten: ? 30 g + 15 g + 15 g + 15 g = 75 g of carbohydrates total. Summary  Carbohydrate counting is a method of keeping track of how many carbohydrates you eat.  Eating carbohydrates naturally increases the amount of sugar (glucose) in the blood.  Counting how many carbohydrates you eat helps keep your blood glucose within normal limits, which helps you manage your diabetes.  A diet and nutrition specialist (registered dietitian) can help you make a meal plan and calculate how many carbohydrates you should have at each meal and snack. This information is not intended to replace advice given to you by your health care provider. Make sure you discuss any questions you have with your health care provider. Document Released: 11/26/2005 Document Revised: 06/20/2017 Document Reviewed: 05/09/2016 Elsevier Patient Education  2020 [employer]  

## 2019-10-02 NOTE — Progress Notes (Signed)
New Patient Office Visit  Subjective:  Patient ID: James Kent, male    DOB: 11/21/1966  Age: 53 y.o. MRN: [id number]  CC:  Chief Complaint  Patient presents with  . Establish Care  . Diabetes  . Hyperlipidemia  . Hypertension  . Hypothyroidism    HPI James Kent presents for new patient visit to establish care.  Introduced to Publishing rights managernurse practitioner role and practice setting.  All questions answered.   DIABETES Currently taking Metformin 1000 MG BID, was on Glipizide but was discontinued.  Has been diagnosed glaucoma, last eye exam in June and was told he may need surgery.  Was not using eye drops consistently at that point and has been since.  Was diagnose with glaucoma about 10 years ago.  Diagnosed with diabetes 6-7 years ago. Reports he has been running 70-80's on blood sugars back in June, but has not checked recently.   Hypoglycemic episodes:no Polydipsia/polyuria: no Visual disturbance: no Chest pain: no Paresthesias: no Glucose Monitoring: no  Accucheck frequency: Not Checking  Fasting glucose:  Post prandial:  Evening:  Before meals: Taking Insulin?: no  Long acting insulin:  Short acting insulin: Blood Pressure Monitoring: rarely Retinal Examination: Up to Date Foot Exam: Not up to Date Pneumovax: unknown Influenza: refused Aspirin: no   HYPERTENSION / HYPERLIPIDEMIA Continues on Lisinopril and Atorvastatin + Fenofibrate.  Is a current every day smoker and not interested in quitting at this time. Satisfied with current treatment? yes Duration of hypertension: chronic BP monitoring frequency: rarely BP range: 130/70 at home BP medication side effects: no Duration of hyperlipidemia: chronic Cholesterol medication side effects: no Cholesterol supplements: none Medication compliance: good compliance Aspirin: no Recent stressors: no Recurrent headaches: no Visual changes: no Palpitations: no Dyspnea: no Chest pain: no Lower extremity edema: no  Dizzy/lightheaded: no   HYPOTHYROIDISM Continues on Levothyroxine 200 MCG.  Is taking it in morning along with all other medications. Thyroid control status:stable Satisfied with current treatment? yes Medication side effects: no Medication compliance: good compliance Etiology of hypothyroidism:  Recent dose adjustment:no Fatigue: no Cold intolerance: no Heat intolerance: no Weight gain: no Weight loss: no Constipation: no Diarrhea/loose stools: no Palpitations: no Lower extremity edema: no Anxiety/depressed mood: no  Past Medical History:  Diagnosis Date  . Asthma   . Diabetes mellitus without complication (HCC)   . Hyperlipidemia   . Hypertension   . Thyroid disease     Past Surgical History:  Procedure Laterality Date  . CARPAL TUNNEL RELEASE    . CHOLECYSTECTOMY N/A 02/21/2019   Procedure: LAPAROSCOPIC CHOLECYSTECTOMY;  Surgeon: Sung AmabileSakai, Isami, DO;  Location: [facility] ORS;  Service: General;  Laterality: N/A;  . ERCP N/A 03/03/2019   Procedure: ENDOSCOPIC RETROGRADE CHOLANGIOPANCREATOGRAPHY (ERCP);  Surgeon: Midge MiniumWohl, Darren, MD;  [facility];  Service: Endoscopy;  Laterality: N/A;  . ERCP N/A 06/02/2019   Procedure: ENDOSCOPIC RETROGRADE CHOLANGIOPANCREATOGRAPHY (ERCP);  Surgeon: Midge MiniumWohl, Darren, MD;  [facility];  Service: Endoscopy;  Laterality: N/A;  . knees Bilateral     Family History  Problem Relation Age of Onset  . Cancer Mother   . Diabetes Father   . Hyperlipidemia Father   . Hypertension Father   . Glaucoma Father     Social History   Socioeconomic History  . Marital status: Single    Spouse name: Not on file  . Number of children: Not on file  . Years of education: Not on file  . Highest education level: Not on file  Occupational History  .  Not on file  Social Needs  . Financial resource strain: Not very hard  . Food insecurity    Worry: Never true    Inability: Never true  . Transportation needs    Medical: No     Non-medical: No  Tobacco Use  . Smoking status: Current Every Day Smoker    Packs/day: 0.50    Types: Cigarettes  . Smokeless tobacco: Never Used  Substance and Sexual Activity  . Alcohol use: Yes    Frequency: Never    Comment: on occasion  . Drug use: Never  . Sexual activity: Not Currently  Lifestyle  . Physical activity    Days per week: 2 days    Minutes per session: 30 min  . Stress: Only a little  Relationships  . Social connections    Talks on phone: More than three times a week    Gets together: More than three times a week    Attends religious service: Never    Active member of club or organization: No    Attends meetings of clubs or organizations: Never    Relationship status: Not on file  . Intimate partner violence    Fear of current or ex partner: No    Emotionally abused: No    Physically abused: No    Forced sexual activity: No  Other Topics Concern  . Not on file  Social History Narrative  . Not on file    ROS Review of Systems  Constitutional: Negative for activity change, diaphoresis, fatigue and fever.  Respiratory: Negative for cough, chest tightness, shortness of breath and wheezing.   Cardiovascular: Negative for chest pain, palpitations and leg swelling.  Gastrointestinal: Negative for abdominal distention, abdominal pain, constipation, diarrhea, nausea and vomiting.  Endocrine: Negative for cold intolerance, heat intolerance, polydipsia, polyphagia and polyuria.  Neurological: Negative for dizziness, syncope, weakness, light-headedness, numbness and headaches.  Psychiatric/Behavioral: Negative.     Objective:   Today's Vitals: BP 135/87   Pulse 87   Temp 98.9 F (37.2 C) (Oral)   Ht 5' 7.2" (1.707 m)   Wt 218 lb 6.4 oz (99.1 kg)   SpO2 98%   BMI 34.00 kg/m   Physical Exam Vitals signs and nursing note reviewed.  Constitutional:      General: He is awake. He is not in acute distress.    Appearance: He is well-developed and  overweight. He is not ill-appearing.  HENT:     Head: Normocephalic and atraumatic.     Right Ear: Hearing normal. No drainage.     Left Ear: Hearing normal. No drainage.  Eyes:     General: Lids are normal.        Right eye: No discharge.        Left eye: No discharge.     Conjunctiva/sclera: Conjunctivae normal.     Pupils: Pupils are equal, round, and reactive to light.  Neck:     Musculoskeletal: Normal range of motion and neck supple.     Thyroid: No thyromegaly.     Vascular: No carotid bruit.  Cardiovascular:     Rate and Rhythm: Normal rate and regular rhythm.     Heart sounds: Normal heart sounds, S1 normal and S2 normal. No murmur. No gallop.   Pulmonary:     Effort: Pulmonary effort is normal. No accessory muscle usage or respiratory distress.     Breath sounds: Normal breath sounds.  Abdominal:     General: Bowel sounds are normal.  Palpations: Abdomen is soft.  Musculoskeletal: Normal range of motion.     Right lower leg: No edema.     Left lower leg: No edema.  Skin:    General: Skin is warm and dry.     Capillary Refill: Capillary refill takes less than 2 seconds.  Neurological:     Mental Status: He is alert and oriented to person, place, and time.     Deep Tendon Reflexes: Reflexes are normal and symmetric.  Psychiatric:        Mood and Affect: Mood normal.        Behavior: Behavior normal. Behavior is cooperative.        Thought Content: Thought content normal.        Judgment: Judgment normal.     Assessment & Plan:   Problem List Items Addressed This Visit      Cardiovascular and Mediastinum   Essential hypertension    Chronic, ongoing.  BP at goal today.  Continue current medication regimen and adjust as needed, Lisinopril for kidney protection.  Obtain labs today.  Return in 6 weeks.      Relevant Medications   lisinopril (ZESTRIL) 20 MG tablet   fenofibrate 160 MG tablet   atorvastatin (LIPITOR) 40 MG tablet   Other Relevant Orders    CBC with Differential/Platelet     Endocrine   Type 2 diabetes mellitus without complication, without long-term current use of insulin (HCC) - Primary    Chronic, ongoing.  Continue Metformin and adjust as needed.  Recommend checking BS at home daily and documenting.  Obtain A1C today and adjust regimen as needed.  Return in 6 weeks.      Relevant Medications   metFORMIN (GLUCOPHAGE) 1000 MG tablet   lisinopril (ZESTRIL) 20 MG tablet   atorvastatin (LIPITOR) 40 MG tablet   Other Relevant Orders   HgB A1c   Hypothyroidism    Chronic, ongoing.  Continue current medication regimen and adjust as needed.  Thyroid panel today.  Educated to take medication on own in morning, at least 30 minutes prior to other medications and meal.      Relevant Medications   levothyroxine (SYNTHROID) 200 MCG tablet   Other Relevant Orders   Thyroid Panel With TSH   Hyperlipidemia associated with type 2 diabetes mellitus (HCC)    Chronic, ongoing.  Continue Lipitor and adjust as needed.  Labs today.      Relevant Medications   metFORMIN (GLUCOPHAGE) 1000 MG tablet   lisinopril (ZESTRIL) 20 MG tablet   fenofibrate 160 MG tablet   atorvastatin (LIPITOR) 40 MG tablet   Other Relevant Orders   Comprehensive metabolic panel   Lipid Panel w/o Chol/HDL Ratio   Diabetic glaucoma (HCC)    Chronic, ongoing.  Continue current medication regimen.  Referral to local ophthalmology to establish care, Dr. Clydene Pugh.      Relevant Medications   latanoprost (XALATAN) 0.005 % ophthalmic solution   metFORMIN (GLUCOPHAGE) 1000 MG tablet   lisinopril (ZESTRIL) 20 MG tablet   atorvastatin (LIPITOR) 40 MG tablet   Other Relevant Orders   Ambulatory referral to Ophthalmology     Nervous and Auditory   Nicotine dependence, cigarettes, w unsp disorders    I have recommended complete cessation of tobacco use. I have discussed various options available for assistance with tobacco cessation including over the counter methods  (Nicotine gum, patch and lozenges). We also discussed prescription options (Chantix, Nicotine Inhaler / Nasal Spray). The patient is not interested in  pursuing any prescription tobacco cessation options at this time.          Outpatient Encounter Medications as of 10/02/2019  Medication Sig  . atorvastatin (LIPITOR) 40 MG tablet Take 1 tablet (40 mg total) by mouth daily.  . fenofibrate 160 MG tablet Take 1 tablet (160 mg total) by mouth daily.  Marland Kitchen latanoprost (XALATAN) 0.005 % ophthalmic solution Place 1 drop into both eyes at bedtime.  Marland Kitchen levothyroxine (SYNTHROID) 200 MCG tablet Take 1 tablet (200 mcg total) by mouth daily.  Marland Kitchen lisinopril (ZESTRIL) 20 MG tablet Take 1 tablet (20 mg total) by mouth daily.  . metFORMIN (GLUCOPHAGE) 1000 MG tablet Take 1 tablet (1,000 mg total) by mouth 2 (two) times daily.  . [DISCONTINUED] atorvastatin (LIPITOR) 40 MG tablet Take 40 mg by mouth daily.  . [DISCONTINUED] fenofibrate 160 MG tablet Take 160 mg by mouth daily.  . [DISCONTINUED] levothyroxine (SYNTHROID, LEVOTHROID) 200 MCG tablet Take 200 mcg by mouth daily.  . [DISCONTINUED] lisinopril (ZESTRIL) 20 MG tablet Take 20 mg by mouth daily.  . [DISCONTINUED] metFORMIN (GLUCOPHAGE) 1000 MG tablet Take 1,000 mg by mouth 2 (two) times daily.  . [DISCONTINUED] fluticasone furoate-vilanterol (BREO ELLIPTA) 100-25 MCG/INH AEPB Inhale 1 puff into the lungs daily.  . [DISCONTINUED] gabapentin (NEURONTIN) 300 MG capsule Take 300 mg by mouth daily.  . [DISCONTINUED] glipiZIDE (GLUCOTROL) 5 MG tablet Take 5 mg by mouth daily.  . [DISCONTINUED] ibuprofen (ADVIL,MOTRIN) 800 MG tablet Take 1 tablet (800 mg total) by mouth every 8 (eight) hours as needed for mild pain or moderate pain.  . [DISCONTINUED] lisinopril (PRINIVIL,ZESTRIL) 40 MG tablet Take 40 mg by mouth daily.  . [DISCONTINUED] omeprazole (PRILOSEC) 20 MG capsule Take 20 mg by mouth daily.   No facility-administered encounter medications on file as of  10/02/2019.     Follow-up: Return in about 6 weeks (around 11/13/2019) for T2DM, HTN/HLD, Hypothyroid.   Marjie Skiff, NP

## 2019-10-02 NOTE — Assessment & Plan Note (Signed)
Chronic, ongoing.  Continue Lipitor and adjust as needed.  Labs today.

## 2019-10-02 NOTE — Assessment & Plan Note (Signed)
Chronic, ongoing.  Continue Metformin and adjust as needed.  Recommend checking BS at home daily and documenting.  Obtain A1C today and adjust regimen as needed.  Return in 6 weeks.

## 2019-10-03 LAB — CBC WITH DIFFERENTIAL/PLATELET
Basophils Absolute: 0.1 10*3/uL (ref 0.0–0.2)
Basos: 1 %
EOS (ABSOLUTE): 0.3 10*3/uL (ref 0.0–0.4)
Eos: 3 %
Hematocrit: 46 % (ref 37.5–51.0)
Hemoglobin: 15.3 g/dL (ref 13.0–17.7)
Immature Grans (Abs): 0 10*3/uL (ref 0.0–0.1)
Immature Granulocytes: 0 %
Lymphocytes Absolute: 3.9 10*3/uL — ABNORMAL HIGH (ref 0.7–3.1)
Lymphs: 37 %
MCH: 29.8 pg (ref 26.6–33.0)
MCHC: 33.3 g/dL (ref 31.5–35.7)
MCV: 90 fL (ref 79–97)
Monocytes Absolute: 0.6 10*3/uL (ref 0.1–0.9)
Monocytes: 6 %
Neutrophils Absolute: 5.6 10*3/uL (ref 1.4–7.0)
Neutrophils: 53 %
Platelets: 273 10*3/uL (ref 150–450)
RBC: 5.13 x10E6/uL (ref 4.14–5.80)
RDW: 13.1 % (ref 11.6–15.4)
WBC: 10.5 10*3/uL (ref 3.4–10.8)

## 2019-10-03 LAB — COMPREHENSIVE METABOLIC PANEL
ALT: 28 IU/L (ref 0–44)
AST: 23 IU/L (ref 0–40)
Albumin/Globulin Ratio: 1.5 (ref 1.2–2.2)
Albumin: 4.7 g/dL (ref 3.8–4.9)
Alkaline Phosphatase: 99 IU/L (ref 39–117)
BUN/Creatinine Ratio: 11 (ref 9–20)
BUN: 11 mg/dL (ref 6–24)
Bilirubin Total: 0.7 mg/dL (ref 0.0–1.2)
CO2: 22 mmol/L (ref 20–29)
Calcium: 10.2 mg/dL (ref 8.7–10.2)
Chloride: 96 mmol/L (ref 96–106)
Creatinine, Ser: 0.96 mg/dL (ref 0.76–1.27)
GFR calc Af Amer: 103 mL/min/{1.73_m2} (ref >59)
GFR calc non Af Amer: 89 mL/min/{1.73_m2} (ref >59)
Globulin, Total: 3.1 g/dL (ref 1.5–4.5)
Glucose: 238 mg/dL — ABNORMAL HIGH (ref 65–99)
Potassium: 4.4 mmol/L (ref 3.5–5.2)
Sodium: 135 mmol/L (ref 134–144)
Total Protein: 7.8 g/dL (ref 6.0–8.5)

## 2019-10-03 LAB — LIPID PANEL W/O CHOL/HDL RATIO
Cholesterol, Total: 161 mg/dL (ref 100–199)
HDL: 35 mg/dL — ABNORMAL LOW (ref >39)
LDL Chol Calc (NIH): 52 mg/dL (ref 0–99)
Triglycerides: 496 mg/dL — ABNORMAL HIGH (ref 0–149)
VLDL Cholesterol Cal: 74 mg/dL — ABNORMAL HIGH (ref 5–40)

## 2019-10-03 LAB — THYROID PANEL WITH TSH
Free Thyroxine Index: 4.3 (ref 1.2–4.9)
T3 Uptake Ratio: 39 % (ref 24–39)
T4, Total: 10.9 ug/dL (ref 4.5–12.0)
TSH: 0.167 u[IU]/mL — ABNORMAL LOW (ref 0.450–4.500)

## 2019-10-03 LAB — HEMOGLOBIN A1C
Est. average glucose Bld gHb Est-mCnc: 229 mg/dL
Hgb A1c MFr Bld: 9.6 % — ABNORMAL HIGH (ref 4.8–5.6)

## 2019-10-05 ENCOUNTER — Telehealth: Payer: Self-pay | Admitting: Nurse Practitioner

## 2019-10-05 ENCOUNTER — Encounter: Payer: Self-pay | Admitting: Nurse Practitioner

## 2019-10-05 ENCOUNTER — Other Ambulatory Visit: Payer: Self-pay | Admitting: Nurse Practitioner

## 2019-10-05 DIAGNOSIS — J449 Chronic obstructive pulmonary disease, unspecified: Secondary | ICD-10-CM | POA: Insufficient documentation

## 2019-10-05 DIAGNOSIS — J410 Simple chronic bronchitis: Secondary | ICD-10-CM

## 2019-10-05 DIAGNOSIS — E119 Type 2 diabetes mellitus without complications: Secondary | ICD-10-CM

## 2019-10-05 MED ORDER — LEVOTHYROXINE SODIUM 175 MCG PO TABS: 175.0000 ug | ORAL_TABLET | Freq: Every day | ORAL | 3 refills | Status: AC

## 2019-10-05 NOTE — Telephone Encounter (Signed)
Spoke to patient via telephone about lab results, A1C 9.6%.  He does endorse very poor diet choices with moving here from [place] and stressors of this.  Prior to this he reports well-controlled A1C levels.  Discussed addition of GLP or SLGT, but at this time he wishes not to add more medication but to continue Metformin 1000 MG BID and focus on diet as he previously did.  This weekend went out and bought more greens and salads.  Discussed that TRIGS were elevated in 400's, he had ate prior to visit.  Advised him to continue Lipitor and Fenofibrate daily.  TSH level on low side, will switch Levothyroxine to 175 MCG daily and recheck levels next visit.  He was able to verbalize plan back.  He also discussed inhaler regimen he was previously on for COPD and cost of this, placed CCM referral for him to discuss inhalers further.

## 2019-10-09 ENCOUNTER — Telehealth: Payer: Self-pay

## 2019-10-09 ENCOUNTER — Ambulatory Visit: Payer: Self-pay | Admitting: Pharmacist

## 2019-10-09 NOTE — Chronic Care Management (AMB) (Signed)
  Chronic Care Management   Note  10/09/2019 Name: James Kent MRN: [id number] DOB: Feb 23, 1966  James Kent is a 53 y.o. year old male who is a primary care patient of Cannady, Barbaraann Faster, NP. The CCM team was consulted for assistance with chronic disease management and care coordination needs.  Attempted to contact patient for medication management needs. Left HIPAA compliant message for patient to return my call at his convenience.     Follow up plan: - If I do not hear back, will outreach again within the next 3 weeks  Catie Darnelle Maffucci, PharmD Clinical Pharmacist [place] [phone]

## 2019-10-13 ENCOUNTER — Other Ambulatory Visit: Payer: Self-pay | Admitting: Nurse Practitioner

## 2019-10-13 ENCOUNTER — Ambulatory Visit: Payer: Self-pay | Admitting: Pharmacist

## 2019-10-13 DIAGNOSIS — E119 Type 2 diabetes mellitus without complications: Secondary | ICD-10-CM

## 2019-10-13 DIAGNOSIS — J410 Simple chronic bronchitis: Secondary | ICD-10-CM

## 2019-10-13 MED ORDER — ALBUTEROL SULFATE HFA 108 (90 BASE) MCG/ACT IN AERS: 1.0000 | INHALATION_SPRAY | Freq: Four times a day (QID) | RESPIRATORY_TRACT | 3 refills | Status: AC | PRN

## 2019-10-13 MED ORDER — DULERA 200-5 MCG/ACT IN AERO: 2.0000 | INHALATION_SPRAY | Freq: Two times a day (BID) | RESPIRATORY_TRACT | 3 refills | Status: AC

## 2019-10-13 MED ORDER — GABAPENTIN 300 MG PO CAPS: 300.0000 mg | ORAL_CAPSULE | Freq: Every day | ORAL | 3 refills | Status: AC

## 2019-10-13 NOTE — Chronic Care Management (AMB) (Signed)
Chronic Care Management   Note  10/13/2019 Name: James Kent MRN: [id number] DOB: 1966/10/07   Subjective:  James Kent is a 53 y.o. year old male who is a primary care patient of Cannady, Barbaraann Faster, NP. The CCM team was consulted for assistance with chronic disease management and care coordination needs.     [name] was given information about Chronic Care Management services today including:  1. CCM service includes personalized support from designated clinical staff supervised by his physician, including individualized plan of care and coordination with other care providers 2. 24/7 contact phone numbers for assistance for urgent and routine care needs. 3. Service will only be billed when office clinical staff spend 20 minutes or more in a month to coordinate care. 4. Only one practitioner may furnish and bill the service in a calendar month. 5. The patient may stop CCM services at any time (effective at the end of the month) by phone call to the office staff. 6. The patient will be responsible for cost sharing (co-pay) of up to 20% of the service fee (after annual deductible is met).  Patient agreed to services and verbal consent obtained.   Review of patient status, including review of consultants reports, laboratory and other test data, was performed as part of comprehensive evaluation and provision of chronic care management services.   Objective:  Lab Results  Component Value Date   CREATININE 0.96 10/02/2019   CREATININE 0.57 (L) 03/03/2019   CREATININE 0.58 (L) 03/02/2019    Lab Results  Component Value Date   HGBA1C 9.6 (H) 10/02/2019       Component Value Date/Time   CHOL 161 10/02/2019 1504   TRIG 496 (H) 10/02/2019 1504   HDL 35 (L) 10/02/2019 1504   LDLCALC 52 10/02/2019 1504    Clinical ASCVD: No  The 10-year ASCVD risk score James Bussing DC Jr., et al., 2013) is: 24%   Values used to calculate the score:     Age: 27 years     Sex: Male     Is  Non-Hispanic African American: No     Diabetic: Yes     Tobacco smoker: Yes     Systolic Blood Pressure: 144 mmHg     Is BP treated: Yes     HDL Cholesterol: 35 mg/dL     Total Cholesterol: 161 mg/dL    BP Readings from Last 3 Encounters:  10/02/19 135/87  06/02/19 (!) 133/94  03/03/19 (!) 164/88    No Known Allergies  Medications Reviewed Today    Reviewed by De Hollingshead, [place] (Pharmacist) on 10/13/19 at [facility] Status: <None>  Medication Order Taking? Sig Documenting Provider Last Dose Status Informant  Albuterol Sulfate (PROAIR RESPICLICK) 818 (90 Base) MCG/ACT AEPB [id number] Yes Inhale 1-2 puffs into the lungs. [provider] Taking Active            Med Note Darnelle Maffucci, Arville Lime   Tue Oct 13, 2019 10:04 AM) Using PRN   atorvastatin (LIPITOR) 40 MG tablet [id number] Yes Take 1 tablet (40 mg total) by mouth daily. Marnee Guarneri T, NP Taking Active   Cholecalciferol (VITAMIN D3) 10 MCG (400 UNIT) tablet [id number] Yes Take 400 Units by mouth daily. [provider] Taking Active   fenofibrate 160 MG tablet [id number] Yes Take 1 tablet (160 mg total) by mouth daily. Marnee Guarneri T, NP Taking Active   fluticasone-salmeterol (ADVAIR HFA) 096-28 MCG/ACT inhaler [id number] Yes Inhale 2 puffs into the lungs 2 (  two) times daily. [provider] Taking Active            Med Note ([place] Oct 13, 2019 10:04 AM) Taking PRN   latanoprost (XALATAN) 0.005 % ophthalmic solution [id number] Yes Place 1 drop into both eyes at bedtime. [provider] Taking Active   levothyroxine (SYNTHROID) 175 MCG tablet [id number] Yes Take 1 tablet (175 mcg total) by mouth daily before breakfast. Marnee Guarneri T, NP Taking Active   lisinopril (ZESTRIL) 20 MG tablet [id number] Yes Take 1 tablet (20 mg total) by mouth daily. Marnee Guarneri T, NP Taking Active   metFORMIN (GLUCOPHAGE) 1000 MG tablet [id number] Yes Take 1 tablet (1,000 mg total) by  mouth 2 (two) times daily. Venita Lick, NP Taking Active            Assessment:   Goals Addressed            This Visit's Progress     Patient Stated   . PharmD "I can't afford my inhalers" (pt-stated)       Current Barriers:  . Financial Barriers: patient recently switched to [facility] and reports copay for inhaler therapy is cost prohibitive at this time. Notes that he was previously prescribed Breo and preferred this d/t once daily administration; currently on generic Advair HFA + Proair Respiclick, using both PRN  Pharmacist Clinical Goal(s):  Marland Kitchen Over the next 60 days, patient will work with PharmD and providers to relieve medication access concerns  Interventions: . Comprehensive medication review completed; medication list updated in electronic medical record.  . Patient does NOT meet out of pocket spend requirements for any GSK products (Breo or Advair); however, [employer] does not have an out of pocket spend requirement  . Dulera by Merck: Patient meets income criteria for this medication's patient assistance program. Reviewed application process. Patient will come by clinic next week to sign application. Will collaborate with primary care provider for their portion of application. Once completed, will submit to [employer] patient assistance program. Will collaborate w/ Henrine Screws to order Dulera 200/5 mcg 2 puffs BID and Proventil 1-2 puffs Q4-6H PRN as No Print . Discussed tobacco cessation. Patient notes that he has cut back significantly recently, but wants to quit in the near future. Will further discuss and support patient with this moving forward.  Patient Self Care Activities:  . Patient will provide necessary portions of application   Initial goal documentation     . PharmD "I want to work on my sugars" (pt-stated)       Current Barriers:  . Diabetes: uncontrolled; most recent A1c 9.6% o At last appointment with Marnee Guarneri, NP, patient elected to  focus on diet/lifestyle at this time, rather than adding additional pharmacotherapy . Current antihyperglycemic regimen: metformin 1000 mg BID o Notes having previously been on glipizide  . Denies hypoglycemia . Notes worsening neuropathy ("burning") lately. Wonders about restarting gabapentin. Notes he was previously taking gabapentin 300 mg QAM, but noted that he easily got dizzy . Current meal patterns:  o [place] the area, admits that he has not focused on eating well or exercising . Cardiovascular risk reduction: o Current hypertensive regimen: lisinopril 20 mg QAM o Current hyperlipidemia regimen: atorvastatin 40 mg daily; fenofibrate 160 mg daily; last LDL at goal <70, though TG elevated, likely related to elevated glucose and diet   Pharmacist Clinical Goal(s):  Marland Kitchen Over the next 90  days, patient will work with PharmD and primary care provider to address optimized glucose management  Interventions: . Comprehensive medication review performed, medication list updated in electronic medical record . Discussed goal A1c, goal fasting glucose and goal 2 hour post prandial glucoses . Reiterated risk of hypoglycemia w/ glipizide and recommendation to consider SGLT2 or GLP1 moving forward, pending A1c in 3 months. Discussed that we could pursue patient assistance. Patient verbalized understanding . Discussed that I would recommend gabapentin QPM instead of QAM to avoid daytime dizziness. Will discuss w/ Jolene Cannady if patient could restart gabapentin 300 mg (has large supply at home), or if he should wait until next appointment with her to discuss  Patient Self Care Activities:  . Patient will check blood glucose periodically, document, and provide at future appointments . Patient will take medications as prescribed . Patient will report any questions or concerns to provider   Initial goal documentation        Plan: - Will collaborate with  patient and provider on above  Catie Darnelle Maffucci, PharmD Clinical Pharmacist [place] [phone]

## 2019-10-13 NOTE — Progress Notes (Signed)
Refer to PharmD CCM note for changes.

## 2019-10-13 NOTE — Patient Instructions (Signed)
Visit Information  Goals Addressed            This Visit's Progress     Patient Stated   . PharmD "I can't afford my inhalers" (pt-stated)       Current Barriers:  . Financial Barriers: patient recently switched to [facility] and reports copay for inhaler therapy is cost prohibitive at this time. Notes that he was previously prescribed Breo and preferred this d/t once daily administration; currently on generic Advair HFA + Proair Respiclick, using both PRN  Pharmacist Clinical Goal(s):  Marland Kitchen Over the next 60 days, patient will work with PharmD and providers to relieve medication access concerns  Interventions: . Comprehensive medication review completed; medication list updated in electronic medical record.  . Patient does NOT meet out of pocket spend requirements for any GSK products (Breo or Advair); however, [employer] does not have an out of pocket spend requirement  . Dulera by Merck: Patient meets income criteria for this medication's patient assistance program. Reviewed application process. Patient will come by clinic next week to sign application. Will collaborate with primary care provider for their portion of application. Once completed, will submit to [employer] patient assistance program. Will collaborate w/ Henrine Screws to order Dulera 200/5 mcg 2 puffs BID and Proventil 1-2 puffs Q4-6H PRN as No Print . Discussed tobacco cessation. Patient notes that he has cut back significantly recently, but wants to quit in the near future. Will further discuss and support patient with this moving forward.  Patient Self Care Activities:  . Patient will provide necessary portions of application   Initial goal documentation     . PharmD "I want to work on my sugars" (pt-stated)       Current Barriers:  . Diabetes: uncontrolled; most recent A1c 9.6% o At last appointment with Marnee Guarneri, NP, patient elected to focus on diet/lifestyle at this time, rather than adding additional  pharmacotherapy . Current antihyperglycemic regimen: metformin 1000 mg BID o Notes having previously been on glipizide  . Denies hypoglycemia . Notes worsening neuropathy ("burning") lately. Wonders about restarting gabapentin. Notes he was previously taking gabapentin 300 mg QAM, but noted that he easily got dizzy . Current meal patterns:  o [place], admits that he has not focused on eating well or exercising . Cardiovascular risk reduction: o Current hypertensive regimen: lisinopril 20 mg QAM o Current hyperlipidemia regimen: atorvastatin 40 mg daily; fenofibrate 160 mg daily; last LDL at goal <70, though TG elevated, likely related to elevated glucose and diet   Pharmacist Clinical Goal(s):  Marland Kitchen Over the next 90 days, patient will work with PharmD and primary care provider to address optimized glucose management  Interventions: . Comprehensive medication review performed, medication list updated in electronic medical record . Discussed goal A1c, goal fasting glucose and goal 2 hour post prandial glucoses . Reiterated risk of hypoglycemia w/ glipizide and recommendation to consider SGLT2 or GLP1 moving forward, pending A1c in 3 months. Discussed that we could pursue patient assistance. Patient verbalized understanding . Discussed that I would recommend gabapentin QPM instead of QAM to avoid daytime dizziness. Will discuss w/ Jolene Cannady if patient could restart gabapentin 300 mg (has large supply at home), or if he should wait until next appointment with her to discuss  Patient Self Care Activities:  . Patient will check blood glucose periodically, document, and provide at future appointments . Patient will take medications as prescribed . Patient  will report any questions or concerns to provider   Initial goal documentation        Mr. Resende was given information about Chronic Care Management services today including:  1. CCM service  includes personalized support from designated clinical staff supervised by his physician, including individualized plan of care and coordination with other care providers 2. 24/7 contact phone numbers for assistance for urgent and routine care needs. 3. Service will only be billed when office clinical staff spend 20 minutes or more in a month to coordinate care. 4. Only one practitioner may furnish and bill the service in a calendar month. 5. The patient may stop CCM services at any time (effective at the end of the month) by phone call to the office staff. 6. The patient will be responsible for cost sharing (co-pay) of up to 20% of the service fee (after annual deductible is met).  Patient agreed to services and verbal consent obtained.   The patient verbalized understanding of instructions provided today and declined a print copy of patient instruction materials.   Plan: - Will collaborate with patient and provider on above  Catie Darnelle Maffucci, PharmD Clinical Pharmacist [place] [phone]

## 2019-10-20 ENCOUNTER — Ambulatory Visit: Payer: Self-pay | Admitting: Pharmacist

## 2019-10-20 DIAGNOSIS — J410 Simple chronic bronchitis: Secondary | ICD-10-CM

## 2019-10-20 NOTE — Patient Instructions (Signed)
Visit Information  Goals Addressed            This Visit's Progress     Patient Stated   . PharmD "I can't afford my inhalers" (pt-stated)       Current Barriers:  . Financial Barriers: patient recently switched to [facility] and reports copay for inhaler therapy is cost prohibitive at this time. Notes that he was previously prescribed Breo and preferred this d/t once daily administration; currently on generic Advair HFA + Proair Respiclick, using both PRN . Does not meet out of pocket spend for [place] products, but should qualify for Dulera/Proventil assistance through [employer]. Collaboratively decided to apply for this  Pharmacist Clinical Goal(s):  Marland Kitchen Over the next 60 days, patient will work with PharmD and providers to relieve medication access concerns  Interventions: . Received patient and provider portions of application for Dulera/Proventil through [employer]. Mailed to [employer]. Will follow up in the next 2-3 weeks with [employer] to determine receipt of application and next steps.   Patient Self Care Activities:  . Patient will provide necessary portions of application   Please see past updates related to this goal by clicking on the "Past Updates" button in the selected goal         The patient verbalized understanding of instructions provided today and declined a print copy of patient instruction materials.    Plan: - Will [employer] in 2-3 weeks as above  [employer], PharmD Clinical Pharmacist [employer] [phone]

## 2019-10-20 NOTE — Chronic Care Management (AMB) (Signed)
Chronic Care Management   Follow Up Note   10/20/2019 Name: James Kent MRN: [id number] DOB: February 18, 1966  Referred by: Marjie Skiff, NP Reason for referral : Chronic Care Management (Medication Management)   James Kent is a 53 y.o. year old male who is a primary care patient of Cannady, Dorie Rank, NP. The CCM team was consulted for assistance with chronic disease management and care coordination needs.    Care coordination completed today.   Review of patient status, including review of consultants reports, relevant laboratory and other test results, and collaboration with appropriate care team members and the patient's provider was performed as part of comprehensive patient evaluation and provision of chronic care management services.    SDOH (Social Determinants of Health) screening performed today: Food Insecurity . See Care Plan for related entries.   Outpatient Encounter Medications as of 10/20/2019  Medication Sig Note  . albuterol (PROVENTIL HFA) 108 (90 Base) MCG/ACT inhaler Inhale 1-2 puffs into the lungs every 6 (six) hours as needed for wheezing or shortness of breath.   Marland Kitchen atorvastatin (LIPITOR) 40 MG tablet Take 1 tablet (40 mg total) by mouth daily.   . Cholecalciferol (VITAMIN D3) 10 MCG (400 UNIT) tablet Take 400 Units by mouth daily.   . fenofibrate 160 MG tablet Take 1 tablet (160 mg total) by mouth daily.   . fluticasone-salmeterol (ADVAIR HFA) 115-21 MCG/ACT inhaler Inhale 2 puffs into the lungs 2 (two) times daily. 10/13/2019: Taking PRN   . gabapentin (NEURONTIN) 300 MG capsule Take 1 capsule (300 mg total) by mouth at bedtime.   Marland Kitchen latanoprost (XALATAN) 0.005 % ophthalmic solution Place 1 drop into both eyes at bedtime.   Marland Kitchen levothyroxine (SYNTHROID) 175 MCG tablet Take 1 tablet (175 mcg total) by mouth daily before breakfast.   . lisinopril (ZESTRIL) 20 MG tablet Take 1 tablet (20 mg total) by mouth daily.   . metFORMIN (GLUCOPHAGE) 1000 MG tablet Take 1  tablet (1,000 mg total) by mouth 2 (two) times daily.   . mometasone-formoterol (DULERA) 200-5 MCG/ACT AERO Inhale 2 puffs into the lungs 2 (two) times daily.    No facility-administered encounter medications on file as of 10/20/2019.      Goals Addressed            This Visit's Progress     Patient Stated   . PharmD "I can't afford my inhalers" (pt-stated)       Current Barriers:  . Financial Barriers: patient recently switched to [facility] and reports copay for inhaler therapy is cost prohibitive at this time. Notes that he was previously prescribed Breo and preferred this d/t once daily administration; currently on generic Advair HFA + Proair Respiclick, using both PRN . Does not meet out of pocket spend for GSK products, but should qualify for Dulera/Proventil assistance through [employer]. Collaboratively decided to apply for this  Pharmacist Clinical Goal(s):  Marland Kitchen Over the next 60 days, patient will work with PharmD and providers to relieve medication access concerns  Interventions: . Received patient and provider portions of application for Dulera/Proventil through [employer]. Mailed to [employer]. Will follow up in the next 2-3 weeks with [employer] to determine receipt of application and next steps.   Patient Self Care Activities:  . Patient will provide necessary portions of application   Please see past updates related to this goal by clicking on the "Past Updates" button in the selected goal          Plan: - Will  outreach [employer] in 2-3 weeks as above  [employer], [place] [employer] [phone]

## 2019-10-30 ENCOUNTER — Telehealth: Payer: Self-pay

## 2019-11-03 ENCOUNTER — Ambulatory Visit: Payer: Self-pay | Admitting: Pharmacist

## 2019-11-03 DIAGNOSIS — J410 Simple chronic bronchitis: Secondary | ICD-10-CM

## 2019-11-03 NOTE — Chronic Care Management (AMB) (Signed)
Chronic Care Management   Follow Up Note   11/03/2019 Name: James Kent MRN: [id number] DOB: 20-Jan-1966  Referred by: Venita Lick, NP Reason for referral : Chronic Care Management (Medication Management)   James Kent is a 53 y.o. year old male who is a primary care patient of Cannady, Barbaraann Faster, NP. The CCM team was consulted for assistance with chronic disease management and care coordination needs.    Care coordination completed today.   Review of patient status, including review of consultants reports, relevant laboratory and other test results, and collaboration with appropriate care team members and the patient's provider was performed as part of comprehensive patient evaluation and provision of chronic care management services.    SDOH (Social Determinants of Health) screening performed today: Financial Strain . See Care Plan for related entries.   Outpatient Encounter Medications as of 11/03/2019  Medication Sig Note  . albuterol (PROVENTIL HFA) 108 (90 Base) MCG/ACT inhaler Inhale 1-2 puffs into the lungs every 6 (six) hours as needed for wheezing or shortness of breath.   Marland Kitchen atorvastatin (LIPITOR) 40 MG tablet Take 1 tablet (40 mg total) by mouth daily.   . Cholecalciferol (VITAMIN D3) 10 MCG (400 UNIT) tablet Take 400 Units by mouth daily.   . fenofibrate 160 MG tablet Take 1 tablet (160 mg total) by mouth daily.   . fluticasone-salmeterol (ADVAIR HFA) 115-21 MCG/ACT inhaler Inhale 2 puffs into the lungs 2 (two) times daily. 10/13/2019: Taking PRN   . gabapentin (NEURONTIN) 300 MG capsule Take 1 capsule (300 mg total) by mouth at bedtime.   Marland Kitchen latanoprost (XALATAN) 0.005 % ophthalmic solution Place 1 drop into both eyes at bedtime.   Marland Kitchen levothyroxine (SYNTHROID) 175 MCG tablet Take 1 tablet (175 mcg total) by mouth daily before breakfast.   . lisinopril (ZESTRIL) 20 MG tablet Take 1 tablet (20 mg total) by mouth daily.   . metFORMIN (GLUCOPHAGE) 1000 MG tablet Take 1  tablet (1,000 mg total) by mouth 2 (two) times daily.   . mometasone-formoterol (DULERA) 200-5 MCG/ACT AERO Inhale 2 puffs into the lungs 2 (two) times daily.    No facility-administered encounter medications on file as of 11/03/2019.      Goals Addressed            This Visit's Progress     Patient Stated   . PharmD "I can't afford my inhalers" (pt-stated)       Current Barriers:  . Financial Barriers: patient recently switched to [facility] and reports copay for inhaler therapy is cost prohibitive at this time. Notes that he was previously prescribed Breo and preferred this d/t once daily administration; currently on generic Advair HFA + Proair Respiclick, using both PRN . Does not meet out of pocket spend for [place] products, but should qualify for Dulera/Proventil assistance through [employer]. Collaboratively decided to apply for this o Mailed merck application on 70/96/2836  Pharmacist Clinical Goal(s):  Marland Kitchen Over the next 60 days, patient will work with PharmD and providers to relieve medication access concerns  Interventions: . Contacted [employer]. They have not yet received/processed patient's application. They recommended I check back in 1-2 weeks.  Patient Self Care Activities:  . Patient will provide necessary portions of application   Please see past updates related to this goal by clicking on the "Past Updates" button in the selected goal         Plan: - Will follow up with [employer] in 1-2 weeks regarding status of patient's  application.   Catie Feliz Beam, PharmD, [facility] Clinical Pharmacist [facility] [phone]

## 2019-11-03 NOTE — Patient Instructions (Signed)
Visit Information  Goals Addressed            This Visit's Progress     Patient Stated   . PharmD "I can't afford my inhalers" (pt-stated)       Current Barriers:  . Financial Barriers: patient recently switched to [facility] and reports copay for inhaler therapy is cost prohibitive at this time. Notes that he was previously prescribed Breo and preferred this d/t once daily administration; currently on generic Advair HFA + Proair Respiclick, using both PRN . Does not meet out of pocket spend for [place] products, but should qualify for Dulera/Proventil assistance through [employer]. Collaboratively decided to apply for this o Mailed merck application on 16/60/6301  Pharmacist Clinical Goal(s):  Marland Kitchen Over the next 60 days, patient will work with PharmD and providers to relieve medication access concerns  Interventions: . Contacted [employer]. They have not yet received/processed patient's application. They recommended I check back in 1-2 weeks.  Patient Self Care Activities:  . Patient will provide necessary portions of application   Please see past updates related to this goal by clicking on the "Past Updates" button in the selected goal         The patient verbalized understanding of instructions provided today and declined a print copy of patient instruction materials.   Plan: - Will follow up with [employer] in 1-2 weeks regarding status of patient's application.   Catie Darnelle Maffucci, PharmD, [place] [phone]

## 2019-11-10 ENCOUNTER — Telehealth: Payer: Self-pay

## 2019-11-18 ENCOUNTER — Ambulatory Visit (INDEPENDENT_AMBULATORY_CARE_PROVIDER_SITE_OTHER): Payer: Medicare Other | Admitting: Pharmacist

## 2019-11-18 DIAGNOSIS — J42 Unspecified chronic bronchitis: Secondary | ICD-10-CM | POA: Diagnosis not present

## 2019-11-18 DIAGNOSIS — E119 Type 2 diabetes mellitus without complications: Secondary | ICD-10-CM

## 2019-11-18 NOTE — Chronic Care Management (AMB) (Signed)
Chronic Care Management   Follow Up Note   11/18/2019 Name: James Kent MRN: [id number] DOB: 13-Feb-1966  Referred by: Venita Lick, NP Reason for referral : Chronic Care Management (Medication Management)   Mort Smelser is a 53 y.o. year old male who is a primary care patient of Cannady, Barbaraann Faster, NP. The CCM team was consulted for assistance with chronic disease management and care coordination needs.    Care coordination completed today.   Review of patient status, including review of consultants reports, relevant laboratory and other test results, and collaboration with appropriate care team members and the patient's provider was performed as part of comprehensive patient evaluation and provision of chronic care management services.    SDOH (Social Determinants of Health) screening performed today: Financial Strain . See Care Plan for related entries.   Outpatient Encounter Medications as of 11/18/2019  Medication Sig Note  . albuterol (PROVENTIL HFA) 108 (90 Base) MCG/ACT inhaler Inhale 1-2 puffs into the lungs every 6 (six) hours as needed for wheezing or shortness of breath.   Marland Kitchen atorvastatin (LIPITOR) 40 MG tablet Take 1 tablet (40 mg total) by mouth daily.   . Cholecalciferol (VITAMIN D3) 10 MCG (400 UNIT) tablet Take 400 Units by mouth daily.   . fenofibrate 160 MG tablet Take 1 tablet (160 mg total) by mouth daily.   . fluticasone-salmeterol (ADVAIR HFA) 115-21 MCG/ACT inhaler Inhale 2 puffs into the lungs 2 (two) times daily. 10/13/2019: Taking PRN   . gabapentin (NEURONTIN) 300 MG capsule Take 1 capsule (300 mg total) by mouth at bedtime.   Marland Kitchen latanoprost (XALATAN) 0.005 % ophthalmic solution Place 1 drop into both eyes at bedtime.   Marland Kitchen levothyroxine (SYNTHROID) 175 MCG tablet Take 1 tablet (175 mcg total) by mouth daily before breakfast.   . lisinopril (ZESTRIL) 20 MG tablet Take 1 tablet (20 mg total) by mouth daily.   . metFORMIN (GLUCOPHAGE) 1000 MG tablet Take 1  tablet (1,000 mg total) by mouth 2 (two) times daily.   . mometasone-formoterol (DULERA) 200-5 MCG/ACT AERO Inhale 2 puffs into the lungs 2 (two) times daily.    No facility-administered encounter medications on file as of 11/18/2019.      Goals Addressed            This Visit's Progress     Patient Stated   . PharmD "I can't afford my inhalers" (pt-stated)       Current Barriers:  . Financial Barriers related to COPD, patient recently switched to [facility] and reports copay for inhaler therapy is cost prohibitive at this time. Notes that he was previously prescribed Breo and preferred this d/t once daily administration; currently on generic Advair HFA + Proair Respiclick, using both PRN . Does not meet out of pocket spend for [place] products, but should qualify for Dulera/Proventil assistance through [employer]. Collaboratively decided to apply for this. Mailed merck application on 95/62/1308  Pharmacist Clinical Goal(s):  Marland Kitchen Over the next 60 days, patient will work with PharmD and providers to relieve medication access concerns  Interventions: . Contacted [employer]. They mailed patient his attestation letter on 11/03/2019.  Marland Kitchen Contacted patient, explained attestation letter process. He verbalized understanding, and will be on the lookout for any mail from [employer]. Reviewed that once approved, he will be able to receive [employer] inhalers for free through 2021.  Patient Self Care Activities:  . Patient will provide necessary portions of application   Please see past updates related to this goal  by clicking on the "Past Updates" button in the selected goal      . PharmD "I want to work on my sugars" (pt-stated)       Current Barriers:  . Diabetes: uncontrolled; most recent A1c 9.6% o Elected to focus on diet/exercise after A1c in October . Current antihyperglycemic regimen: metformin 1000 mg BID o Notes having previously been on glipizide  . Denies hypoglycemia . Current glucose  readings:  o Midmorning after breakfast readings 120-130s; though does report random reading >300 . Cardiovascular risk reduction: o Current hypertensive regimen: lisinopril 20 mg QAM o Current hyperlipidemia regimen: atorvastatin 40 mg daily; fenofibrate 160 mg daily; last LDL at goal <70, though TG elevated, likely related to elevated glucose and diet   Pharmacist Clinical Goal(s):  Marland Kitchen Over the next 90 days, patient will work with PharmD and primary care provider to address optimized glucose management  Interventions: . Discussed goal A1c, goal fasting glucose and goal 2 hour post prandial glucoses . Discussed that A1c will be due in January; will evaluate next steps based on that result . Encouraged continued home BG checks  Patient Self Care Activities:  . Patient will check blood glucose periodically, document, and provide at future appointments . Patient will take medications as prescribed . Patient will report any questions or concerns to provider   Please see past updates related to this goal by clicking on the "Past Updates" button in the selected goal          Plan: - Will contact [employer] for updated on application status in the next 3-4 weeks - Will outreach patient for continued medication management support in the next 4-6 weeks  Catie Feliz Beam, PharmD, [facility] Clinical Pharmacist [facility] [phone]

## 2019-11-18 NOTE — Patient Instructions (Signed)
Visit Information  Goals Addressed            This Visit's Progress     Patient Stated   . PharmD "I can't afford my inhalers" (pt-stated)       Current Barriers:  . Financial Barriers related to COPD, patient recently switched to [facility] and reports copay for inhaler therapy is cost prohibitive at this time. Notes that he was previously prescribed Breo and preferred this d/t once daily administration; currently on generic Advair HFA + Proair Respiclick, using both PRN . Does not meet out of pocket spend for [place] products, but should qualify for Dulera/Proventil assistance through [employer]. Collaboratively decided to apply for this. Mailed merck application on 06/04/9484  Pharmacist Clinical Goal(s):  Marland Kitchen Over the next 60 days, patient will work with PharmD and providers to relieve medication access concerns  Interventions: . Contacted [employer]. They mailed patient his attestation letter on 11/03/2019.  Marland Kitchen Contacted patient, explained attestation letter process. He verbalized understanding, and will be on the lookout for any mail from [employer]. Reviewed that once approved, he will be able to receive [employer]ck inhalers for free through 2021.  Patient Self Care Activities:  . Patient will provide necessary portions of application   Please see past updates related to this goal by clicking on the "Past Updates" button in the selected goal      . PharmD "I want to work on my sugars" (pt-stated)       Current Barriers:  . Diabetes: uncontrolled; most recent A1c 9.6% o Elected to focus on diet/exercise after A1c in October . Current antihyperglycemic regimen: metformin 1000 mg BID o Notes having previously been on glipizide  . Denies hypoglycemia . Current glucose readings:  o Midmorning after breakfast readings 120-130s; though does report random reading >300 . Cardiovascular risk reduction: o Current hypertensive regimen: lisinopril 20 mg QAM o Current hyperlipidemia regimen:  atorvastatin 40 mg daily; fenofibrate 160 mg daily; last LDL at goal <70, though TG elevated, likely related to elevated glucose and diet   Pharmacist Clinical Goal(s):  Marland Kitchen Over the next 90 days, patient will work with PharmD and primary care provider to address optimized glucose management  Interventions: . Discussed goal A1c, goal fasting glucose and goal 2 hour post prandial glucoses . Discussed that A1c will be due in January; will evaluate next steps based on that result . Encouraged continued home BG checks  Patient Self Care Activities:  . Patient will check blood glucose periodically, document, and provide at future appointments . Patient will take medications as prescribed . Patient will report any questions or concerns to provider   Please see past updates related to this goal by clicking on the "Past Updates" button in the selected goal         The patient verbalized understanding of instructions provided today and declined a print copy of patient instruction materials.   Plan: - Will contact [employer] for updated on application status in the next 3-4 weeks - Will outreach patient for continued medication management support in the next 4-6 weeks  Catie Darnelle Maffucci, PharmD, [place] [phone]

## 2019-11-20 ENCOUNTER — Encounter: Payer: Self-pay | Admitting: Nurse Practitioner

## 2019-11-20 ENCOUNTER — Other Ambulatory Visit: Payer: Self-pay

## 2019-11-20 ENCOUNTER — Ambulatory Visit (INDEPENDENT_AMBULATORY_CARE_PROVIDER_SITE_OTHER): Payer: Medicare Other | Admitting: Nurse Practitioner

## 2019-11-20 DIAGNOSIS — E039 Hypothyroidism, unspecified: Secondary | ICD-10-CM

## 2019-11-20 DIAGNOSIS — E119 Type 2 diabetes mellitus without complications: Secondary | ICD-10-CM | POA: Diagnosis not present

## 2019-11-20 DIAGNOSIS — E785 Hyperlipidemia, unspecified: Secondary | ICD-10-CM

## 2019-11-20 DIAGNOSIS — E1169 Type 2 diabetes mellitus with other specified complication: Secondary | ICD-10-CM

## 2019-11-20 DIAGNOSIS — I10 Essential (primary) hypertension: Secondary | ICD-10-CM | POA: Diagnosis not present

## 2019-11-20 NOTE — Patient Instructions (Signed)
Carbohydrate Counting for Diabetes Mellitus, Adult  Carbohydrate counting is a method of keeping track of how many carbohydrates you eat. Eating carbohydrates naturally increases the amount of sugar (glucose) in the blood. Counting how many carbohydrates you eat helps keep your blood glucose within normal limits, which helps you manage your diabetes (diabetes mellitus). It is important to know how many carbohydrates you can safely have in each meal. This is different for every person. A diet and nutrition specialist (registered dietitian) can help you make a meal plan and calculate how many carbohydrates you should have at each meal and snack. Carbohydrates are found in the following foods:  Grains, such as breads and cereals.  Dried beans and soy products.  Starchy vegetables, such as potatoes, peas, and corn.  Fruit and fruit juices.  Milk and yogurt.  Sweets and snack foods, such as cake, cookies, candy, chips, and soft drinks. How do I count carbohydrates? There are two ways to count carbohydrates in food. You can use either of the methods or a combination of both. Reading "Nutrition Facts" on packaged food The "Nutrition Facts" list is included on the labels of almost all packaged foods and beverages in the [place] It includes:  The serving size.  Information about nutrients in each serving, including the grams (g) of carbohydrate per serving. To use the "Nutrition Facts":  Decide how many servings you will have.  Multiply the number of servings by the number of carbohydrates per serving.  The resulting number is the total amount of carbohydrates that you will be having. Learning standard serving sizes of other foods When you eat carbohydrate foods that are not packaged or do not include "Nutrition Facts" on the label, you need to measure the servings in order to count the amount of carbohydrates:  Measure the foods that you will eat with a food scale or measuring cup, if needed.   Decide how many standard-size servings you will eat.  Multiply the number of servings by 15. Most carbohydrate-rich foods have about 15 g of carbohydrates per serving. ? For example, if you eat 8 oz (170 g) of strawberries, you will have eaten 2 servings and 30 g of carbohydrates (2 servings x 15 g = 30 g).  For foods that have more than one food mixed, such as soups and casseroles, you must count the carbohydrates in each food that is included. The following list contains standard serving sizes of common carbohydrate-rich foods. Each of these servings has about 15 g of carbohydrates:   hamburger bun or  English muffin.   oz (15 mL) syrup.   oz (14 g) jelly.  1 slice of bread.  1 six-inch tortilla.  3 oz (85 g) cooked rice or pasta.  4 oz (113 g) cooked dried beans.  4 oz (113 g) starchy vegetable, such as peas, corn, or potatoes.  4 oz (113 g) hot cereal.  4 oz (113 g) mashed potatoes or  of a large baked potato.  4 oz (113 g) canned or frozen fruit.  4 oz (120 mL) fruit juice.  4-6 crackers.  6 chicken nuggets.  6 oz (170 g) unsweetened dry cereal.  6 oz (170 g) plain fat-free yogurt or yogurt sweetened with artificial sweeteners.  8 oz (240 mL) milk.  8 oz (170 g) fresh fruit or one small piece of fruit.  24 oz (680 g) popped popcorn. Example of carbohydrate counting Sample meal  3 oz (85 g) chicken breast.  6 oz (170 g)   brown rice.  4 oz (113 g) corn.  8 oz (240 mL) milk.  8 oz (170 g) strawberries with sugar-free whipped topping. Carbohydrate calculation 1. Identify the foods that contain carbohydrates: ? Rice. ? Corn. ? Milk. ? Strawberries. 2. Calculate how many servings you have of each food: ? 2 servings rice. ? 1 serving corn. ? 1 serving milk. ? 1 serving strawberries. 3. Multiply each number of servings by 15 g: ? 2 servings rice x 15 g = 30 g. ? 1 serving corn x 15 g = 15 g. ? 1 serving milk x 15 g = 15 g. ? 1 serving  strawberries x 15 g = 15 g. 4. Add together all of the amounts to find the total grams of carbohydrates eaten: ? 30 g + 15 g + 15 g + 15 g = 75 g of carbohydrates total. Summary  Carbohydrate counting is a method of keeping track of how many carbohydrates you eat.  Eating carbohydrates naturally increases the amount of sugar (glucose) in the blood.  Counting how many carbohydrates you eat helps keep your blood glucose within normal limits, which helps you manage your diabetes.  A diet and nutrition specialist (registered dietitian) can help you make a meal plan and calculate how many carbohydrates you should have at each meal and snack. This information is not intended to replace advice given to you by your health care provider. Make sure you discuss any questions you have with your health care provider. Document Released: 11/26/2005 Document Revised: 06/20/2017 Document Reviewed: 05/09/2016 Elsevier Patient Education  2020 [employer]  

## 2019-11-20 NOTE — Assessment & Plan Note (Signed)
Chronic, ongoing.  At goal at recent visit, but not checking at home.  Continue current medication regimen and adjust as needed, Lisinopril for kidney protection.  Recommend checking BP at home regularly.  Advised him to find new PCP as soon as possible in [place], so he can establish care and continue to monitor labs.

## 2019-11-20 NOTE — Assessment & Plan Note (Signed)
Chronic, ongoing.  Continue current medication regimen and adjust as needed.  Advised him to find new PCP as soon as possible in [place], so he can establish care and continue to monitor labs.

## 2019-11-20 NOTE — Assessment & Plan Note (Signed)
Chronic, ongoing.  Continue Lipitor and adjust as needed.  Advised him to find new PCP as soon as possible in [place], so he can establish care and continue to monitor labs.

## 2019-11-20 NOTE — Progress Notes (Signed)
There were no vitals taken for this visit.   Subjective:    Patient ID: James PearsonWilliam Kent, male    DOB: 07/14/1966, 53 y.o.   MRN: [id number]  HPI: James Kent is a 53 y.o. male  Chief Complaint  Patient presents with  . Diabetes  . Hyperlipidemia  . Hypertension  . Hypothyroidism    . This visit was completed via telephone due to the restrictions of the COVID-19 pandemic. All issues as above were discussed and addressed but no physical exam was performed. If it was felt that the patient should be evaluated in the office, they were directed there. The patient verbally consented to this visit. Patient was unable to complete an audio/visual visit due to Lack of equipment. Due to the catastrophic nature of the COVID-19 pandemic, this visit was done through audio contact only. . Location of the patient: home . Location of the provider: work . Those involved with this call:  . Provider: Aura DialsJolene Talin Rozeboom, DNP . CMA: Wilhemena DurieBrittany Russell, CMA . Front Desk/Registration: Harriet PhoJoliza Johnson  . Time spent on call: 15 minutes on the phone discussing health concerns. 10 minutes total spent in review of patient's record and preparation of their chart.  . I verified patient identity using two factors (patient name and date of birth). Patient consents verbally to being seen via telemedicine visit today.    He moved to [place] and this is 2 hours away.  So is having to find a new provider and will do this once his new insurance starts in January.  DIABETES Currently taking Metformin 1000 MG BID, A1C recently 9.6%, he had wanted to continue focus on diet changes since he just moved from [place] and not add medication.  Has been working with CCM team.  Been diagnosed glaucoma, last eye exam in June and was told he may need surgery.  Was diagnose with glaucoma about 10 years ago.  Diagnosed with diabetes 6-7 years ago.  Hypoglycemic episodes:no Polydipsia/polyuria: no Visual disturbance: no Chest pain:  no Paresthesias: no Glucose Monitoring: no             Accucheck frequency: Daily             Fasting glucose: 143 this morning (not fasting) -- 120-130 average             Post prandial:             Evening:             Before meals: Taking Insulin?: no             Long acting insulin:             Short acting insulin: Blood Pressure Monitoring: rarely Retinal Examination: Up to Date Foot Exam: Not up to Date Pneumovax: unknown Influenza: refused Aspirin: no   HYPERTENSION / HYPERLIPIDEMIA Continues on Lisinopril and Atorvastatin + Fenofibrate.  Is a current every day smoker and not interested in quitting at this time. Satisfied with current treatment? yes Duration of hypertension: chronic BP monitoring frequency: not checking BP range:  BP medication side effects: no Duration of hyperlipidemia: chronic Cholesterol medication side effects: no Cholesterol supplements: none Medication compliance: good compliance Aspirin: no Recent stressors: no Recurrent headaches: no Visual changes: no Palpitations: no Dyspnea: no Chest pain: no Lower extremity edema: no Dizzy/lightheaded: no   HYPOTHYROIDISM Continues on Levothyroxine 175 MCG, changed to this dose last visit due to low TSH.  Thyroid control status:stable Satisfied with current treatment? yes  Medication side effects: no Medication compliance: good compliance Etiology of hypothyroidism:  Recent dose adjustment: yes, reduction to 175 MCG Fatigue: no Cold intolerance: no Heat intolerance: no Weight gain: no Weight loss: no Constipation: no Diarrhea/loose stools: no Palpitations: no Lower extremity edema: no Anxiety/depressed mood: no  Relevant past medical, surgical, family and social history reviewed and updated as indicated. Interim medical history since our last visit reviewed. Allergies and medications reviewed and updated.  Review of Systems  Constitutional: Negative for activity change, diaphoresis,  fatigue and fever.  Respiratory: Negative for cough, chest tightness, shortness of breath and wheezing.   Cardiovascular: Negative for chest pain, palpitations and leg swelling.  Gastrointestinal: Negative for abdominal distention, abdominal pain, constipation, diarrhea, nausea and vomiting.  Endocrine: Negative for cold intolerance, heat intolerance, polydipsia, polyphagia and polyuria.  Neurological: Negative for dizziness, syncope, weakness, light-headedness, numbness and headaches.  Psychiatric/Behavioral: Negative.     Per HPI unless specifically indicated above     Objective:    There were no vitals taken for this visit.  Wt Readings from Last 3 Encounters:  10/02/19 218 lb 6.4 oz (99.1 kg)  06/02/19 211 lb (95.7 kg)  03/01/19 220 lb (99.8 kg)    Physical Exam   Unable to perform due to telephone visit only.  Results for orders placed or performed in visit on 10/02/19  CBC with Differential/Platelet  Result Value Ref Range   WBC 10.5 3.4 - 10.8 x10E3/uL   RBC 5.13 4.14 - 5.80 x10E6/uL   Hemoglobin 15.3 13.0 - 17.7 g/dL   Hematocrit 61.4 43.1 - 51.0 %   MCV 90 79 - 97 fL   MCH 29.8 26.6 - 33.0 pg   MCHC 33.3 31.5 - 35.7 g/dL   RDW 54.0 08.6 - 76.1 %   Platelets 273 150 - 450 x10E3/uL   Neutrophils 53 Not Estab. %   Lymphs 37 Not Estab. %   Monocytes 6 Not Estab. %   Eos 3 Not Estab. %   Basos 1 Not Estab. %   Neutrophils Absolute 5.6 1.4 - 7.0 x10E3/uL   Lymphocytes Absolute 3.9 (H) 0.7 - 3.1 x10E3/uL   Monocytes Absolute 0.6 0.1 - 0.9 x10E3/uL   EOS (ABSOLUTE) 0.3 0.0 - 0.4 x10E3/uL   Basophils Absolute 0.1 0.0 - 0.2 x10E3/uL   Immature Granulocytes 0 Not Estab. %   Immature Grans (Abs) 0.0 0.0 - 0.1 x10E3/uL  Comprehensive metabolic panel  Result Value Ref Range   Glucose 238 (H) 65 - 99 mg/dL   BUN 11 6 - 24 mg/dL   Creatinine, Ser 9.50 0.76 - 1.27 mg/dL   GFR calc non Af Amer 89 >59 mL/min/1.73   GFR calc Af Amer 103 >59 mL/min/1.73   BUN/Creatinine  Ratio 11 9 - 20   Sodium 135 134 - 144 mmol/L   Potassium 4.4 3.5 - 5.2 mmol/L   Chloride 96 96 - 106 mmol/L   CO2 22 20 - 29 mmol/L   Calcium 10.2 8.7 - 10.2 mg/dL   Total Protein 7.8 6.0 - 8.5 g/dL   Albumin 4.7 3.8 - 4.9 g/dL   Globulin, Total 3.1 1.5 - 4.5 g/dL   Albumin/Globulin Ratio 1.5 1.2 - 2.2   Bilirubin Total 0.7 0.0 - 1.2 mg/dL   Alkaline Phosphatase 99 39 - 117 IU/L   AST 23 0 - 40 IU/L   ALT 28 0 - 44 IU/L  Lipid Panel w/o Chol/HDL Ratio  Result Value Ref Range   Cholesterol, Total 161 100 -  199 mg/dL   Triglycerides 496 (H) 0 - 149 mg/dL   HDL 35 (L) >39 mg/dL   VLDL Cholesterol Cal 74 (H) 5 - 40 mg/dL   LDL Chol Calc (NIH) 52 0 - 99 mg/dL  Thyroid Panel With TSH  Result Value Ref Range   TSH 0.167 (L) 0.450 - 4.500 uIU/mL   T4, Total 10.9 4.5 - 12.0 ug/dL   T3 Uptake Ratio 39 24 - 39 %   Free Thyroxine Index 4.3 1.2 - 4.9  HgB A1c  Result Value Ref Range   Hgb A1c MFr Bld 9.6 (H) 4.8 - 5.6 %   Est. average glucose Bld gHb Est-mCnc 229 mg/dL      Assessment & Plan:   Problem List Items Addressed This Visit      Cardiovascular and Mediastinum   Essential hypertension    Chronic, ongoing.  At goal at recent visit, but not checking at home.  Continue current medication regimen and adjust as needed, Lisinopril for kidney protection.  Recommend checking BP at home regularly.  Advised him to find new PCP as soon as possible in [place], so he can establish care and continue to monitor labs.        Endocrine   Type 2 diabetes mellitus without complication, without long-term current use of insulin (HCC) - Primary    Chronic, ongoing with recent A1C 9.6% (above goal).  Continue Metformin and diet focus.  Recommend checking BS at home daily and documenting.  Advised him to find new PCP as soon as possible in [place], so he can establish care and continue to monitor labs.      Hypothyroidism    Chronic, ongoing.  Continue current medication regimen and adjust as  needed.  Advised him to find new PCP as soon as possible in [place], so he can establish care and continue to monitor labs.      Hyperlipidemia associated with type 2 diabetes mellitus (HCC)    Chronic, ongoing.  Continue Lipitor and adjust as needed.  Advised him to find new PCP as soon as possible in [place], so he can establish care and continue to monitor labs.          Follow up plan: Return for to establish care with new provider in [place], [place].

## 2019-11-20 NOTE — Assessment & Plan Note (Signed)
Chronic, ongoing with recent A1C 9.6% (above goal).  Continue Metformin and diet focus.  Recommend checking BS at home daily and documenting.  Advised him to find new PCP as soon as possible in [place], so he can establish care and continue to monitor labs.

## 2019-12-15 ENCOUNTER — Telehealth: Payer: Self-pay

## 2019-12-29 ENCOUNTER — Ambulatory Visit (INDEPENDENT_AMBULATORY_CARE_PROVIDER_SITE_OTHER): Payer: Medicare Other | Admitting: Pharmacist

## 2019-12-29 DIAGNOSIS — E119 Type 2 diabetes mellitus without complications: Secondary | ICD-10-CM

## 2019-12-29 DIAGNOSIS — J42 Unspecified chronic bronchitis: Secondary | ICD-10-CM

## 2019-12-29 IMAGING — CT CT IMAGE GUIDED DRAINAGE BY PERCUTANEOUS CATHETER
1 of 3 series · 12 of 32 positions shown, 18 images · non-contrast
Comparison: none

INDICATION: Gallbladder fossa abscess
TECHNIQUE: Informed written consent was obtained from the patient after a
thorough discussion of the procedural risks, benefits and
alternatives. All questions were addressed. Maximal Sterile Barrier
Technique was utilized including caps, mask, sterile gowns, sterile
gloves, sterile drape, hand hygiene and skin antiseptic. A timeout
was performed prior to the initiation of the procedure.

[Series 2: i-spiral 5.0 b30f · axial · 0.86mm/px · z∈[+957,+1044]mm · 12 of 31 slices shown, 18 images]
[im 3/31  soft-tissue]
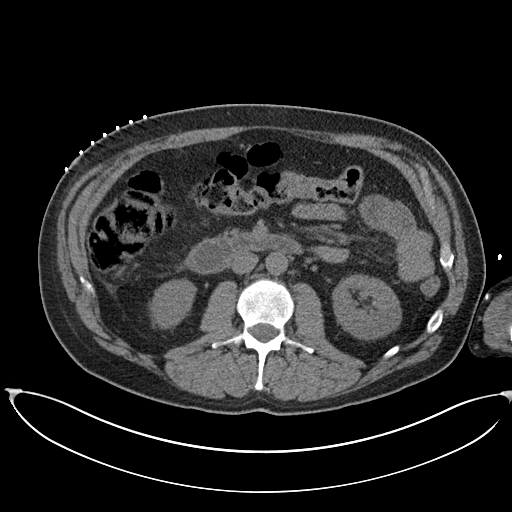
[im 3/31  bone]
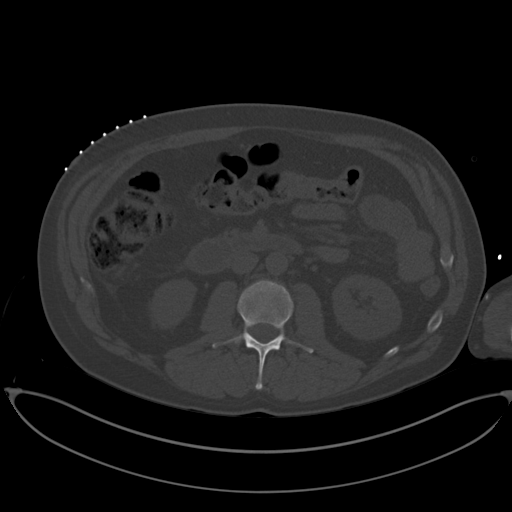
[im 5/31  soft-tissue]
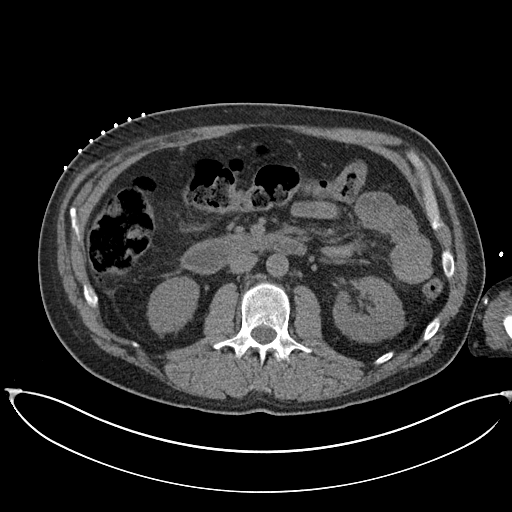
[im 7/31  soft-tissue]
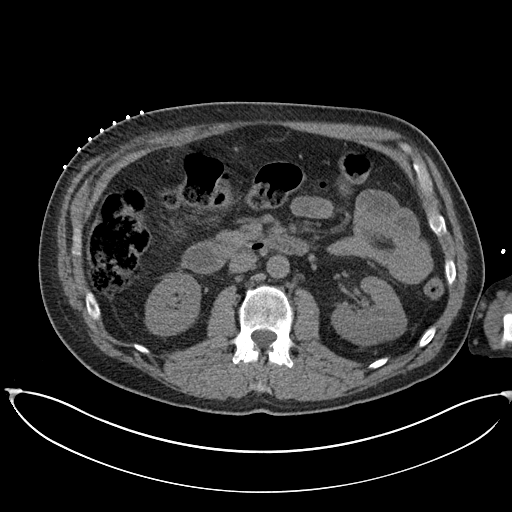
[im 10/31  soft-tissue]
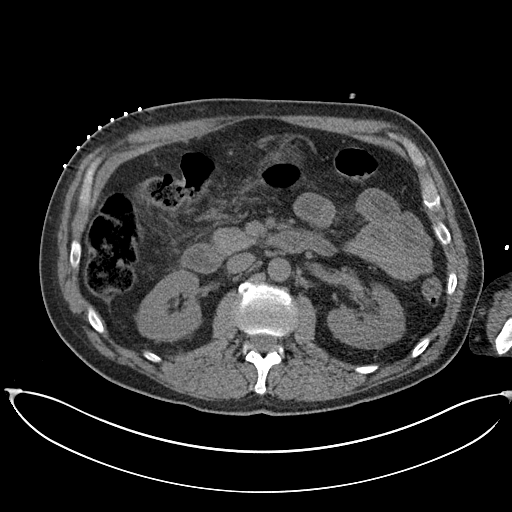
[im 12/31  soft-tissue]
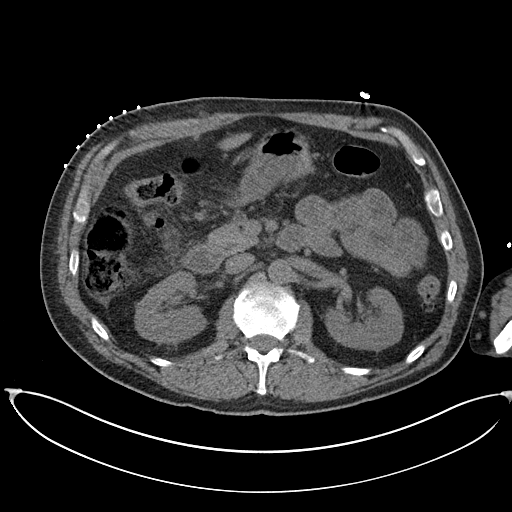
[im 14/31  soft-tissue]
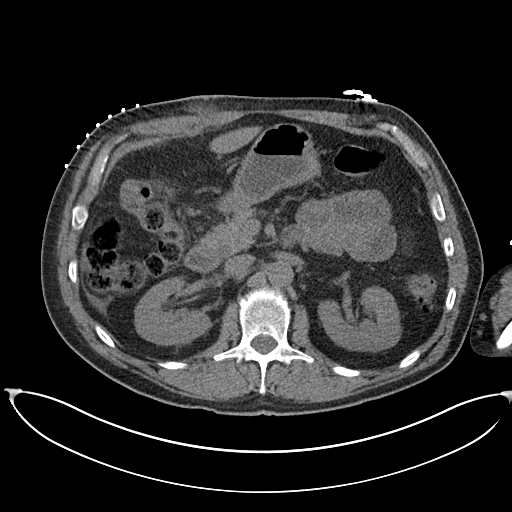
[im 17/31  soft-tissue]
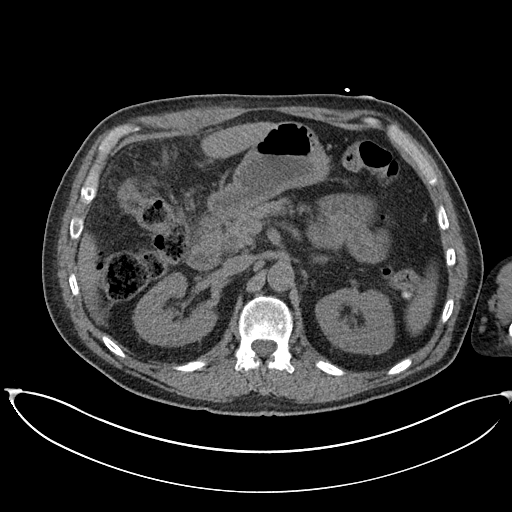
[im 19/31  soft-tissue]
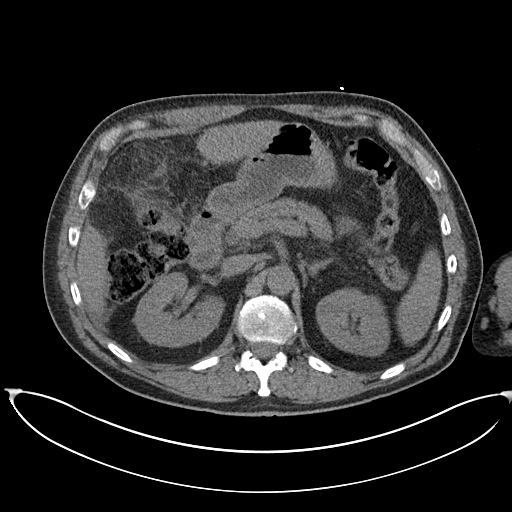
[im 21/31  soft-tissue]
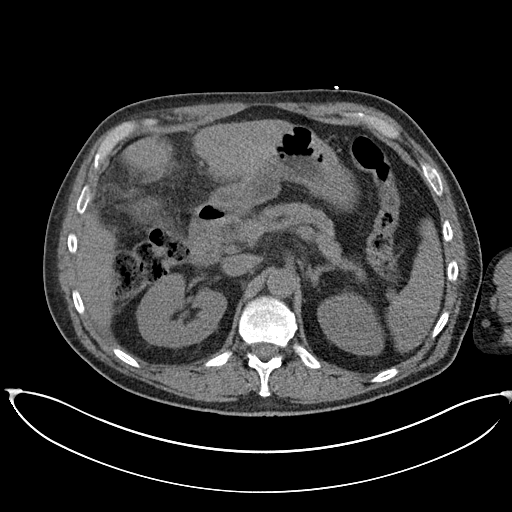
[im 21/31  lung]
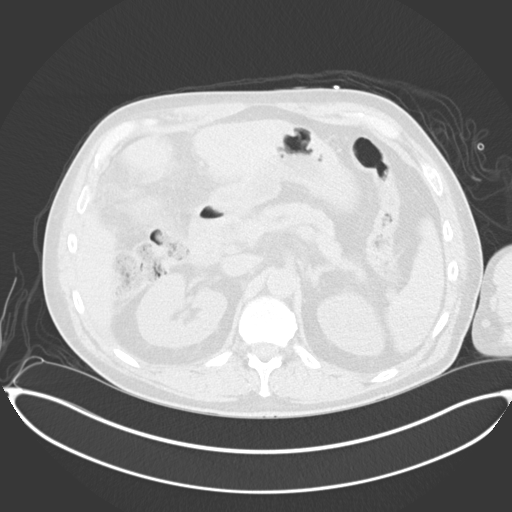
[im 21/31  bone]
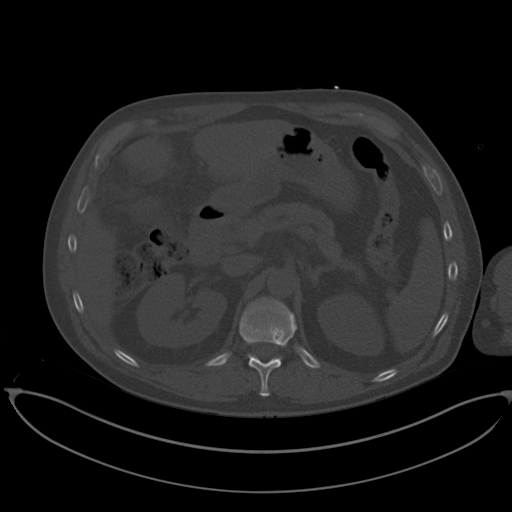
[im 24/31  soft-tissue]
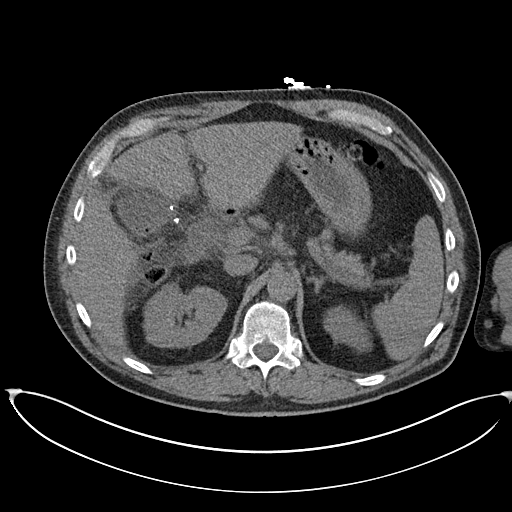
[im 24/31  lung]
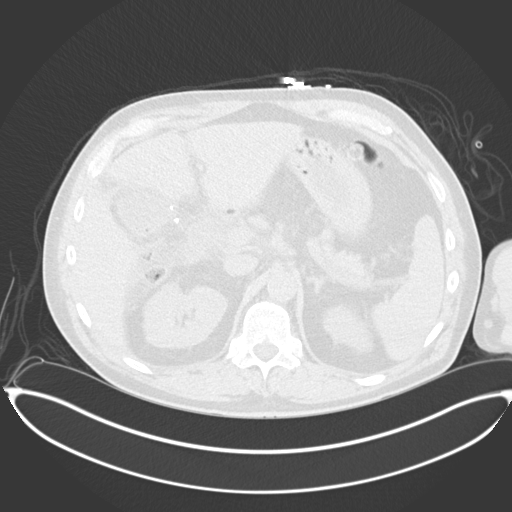
[im 26/31  soft-tissue]
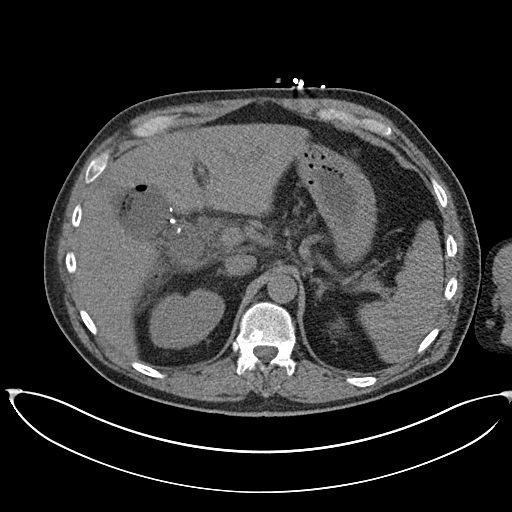
[im 26/31  lung]
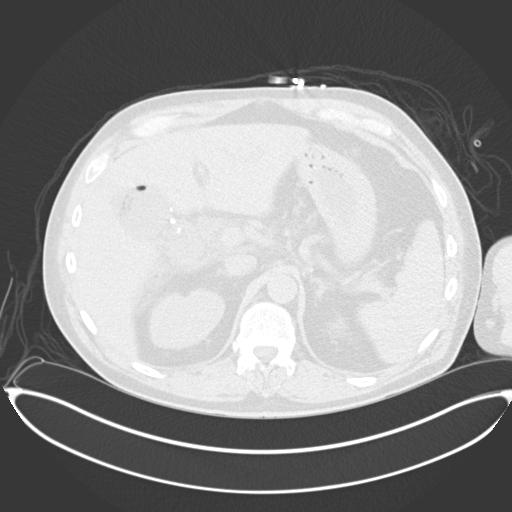
[im 28/31  soft-tissue]
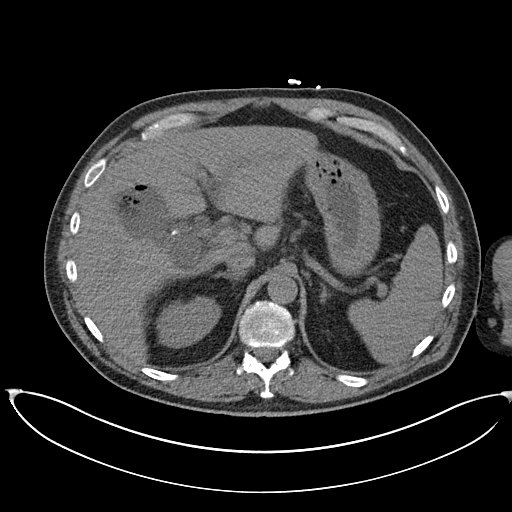
[im 28/31  lung]
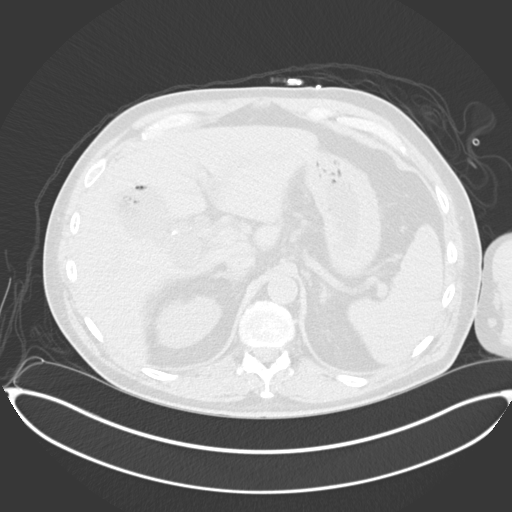

[12 of 32 positions shown; findings below may reference images not displayed]

EXAM:
CT GUIDED DRAINAGE OF GALLBLADDER FOSSA ABSCESS

MEDICATIONS:
The patient is currently admitted to the hospital and receiving
intravenous antibiotics. The antibiotics were administered within an
appropriate time frame prior to the initiation of the procedure.

ANESTHESIA/SEDATION:
Three mg IV Versed 100 mcg IV Fentanyl

Moderate Sedation Time:  10 minutes

The patient was continuously monitored during the procedure by the
interventional radiology nurse under my direct supervision.

COMPLICATIONS:
None immediate.
PROCEDURE:
The right flank was prepped with ChloraPrep in a sterile fashion,
and a sterile drape was applied covering the operative field. A
sterile gown and sterile gloves were used for the procedure. Local
anesthesia was provided with 1% Lidocaine. Under CT guidance, an 18
gauge needle was advanced into the gallbladder fossa fluid
collection. It was removed over an Amplatz wire. Ten French dilator
followed by a 10 French drain were inserted. It was looped and
string fixed in the fluid collection then sewn to the skin. Bilious
fluid was aspirated.
FINDINGS: Images document placement of a 10 French drain in a gallbladder
fossa fluid collection.
IMPRESSION: Successful gallbladder fossa fluid collection 10 French drain
placement.

## 2019-12-29 NOTE — Patient Instructions (Signed)
Visit Information  Goals Addressed            This Visit's Progress     Patient Stated   . PharmD "I can't afford my inhalers" (pt-stated)       Current Barriers:  . Financial Barriers related to COPD; unable to afford Advair. Collaboratively decided to apply for [employer] patient assistance for [employer], as patient's out of pocket spend did not meet criteria for [employer] assistance o Lexicographer on 10/20/2019 o [employer] mailed attestation to patient on 11/03/2019 o Patient reports that he mailed attestation letter back to [employer] "about 2-3 weeks ago" . Patient confirms that he recently moved to [place], [place]. He dropped off paperwork to establish with a new primary care provider yesterday, but may take a bit of time to hear back.  o Confirms he currently has albuterol rescue inhalers, and has been using Primatene mist (OTC inhaled epinephrine) PRN.   Pharmacist Clinical Goal(s):  Marland Kitchen Over the next 60 days, patient will work with PharmD and providers to relieve medication access concerns  Interventions: . Contacted [employer]. They note that they received his attestation letter back, but did not receive the completed application back that was also enclosed. They mailed the attestation form with this need back out to him on 12/18/19.  . Called patient back with this information, left in a message. I encouraged to call back if he has future needs from [place] prior to establishing with a new provider in [place].   Patient Self Care Activities:  . Patient will provide necessary portions of application   Please see past updates related to this goal by clicking on the "Past Updates" button in the selected goal         The patient verbalized understanding of instructions provided today and declined a print copy of patient instruction materials.   Plan:  - Will close case, as patient is establishing with a new PCP.   Catie Feliz Beam, PharmD, [facility] Clinical Pharmacist [facility] [phone]

## 2019-12-29 NOTE — Chronic Care Management (AMB) (Signed)
Chronic Care Management   Follow Up Note   12/29/2019 Name: Walker Sitar MRN: [id number] DOB: [birth year]  Referred by: Marjie Skiff, NP Reason for referral : Chronic Care Management (Medication Management)   Reiley Keisler is a 54 y.o. year old male who is a primary care patient of Cannady, Dorie Rank, NP. The CCM team was consulted for assistance with chronic disease management and care coordination needs.    Contacted patient for medication management review.   Review of patient status, including review of consultants reports, relevant laboratory and other test results, and collaboration with appropriate care team members and the patient's provider was performed as part of comprehensive patient evaluation and provision of chronic care management services.    SDOH (Social Determinants of Health) screening performed today: Financial Strain . See Care Plan for related entries.   Outpatient Encounter Medications as of 12/29/2019  Medication Sig Note  . albuterol (PROVENTIL HFA) 108 (90 Base) MCG/ACT inhaler Inhale 1-2 puffs into the lungs every 6 (six) hours as needed for wheezing or shortness of breath. (Patient not taking: Reported on 11/20/2019)   . atorvastatin (LIPITOR) 40 MG tablet Take 1 tablet (40 mg total) by mouth daily.   . Cholecalciferol (VITAMIN D3) 10 MCG (400 UNIT) tablet Take 400 Units by mouth daily.   . fenofibrate 160 MG tablet Take 1 tablet (160 mg total) by mouth daily.   . fluticasone-salmeterol (ADVAIR HFA) 115-21 MCG/ACT inhaler Inhale 2 puffs into the lungs 2 (two) times daily. 10/13/2019: Taking PRN   . gabapentin (NEURONTIN) 300 MG capsule Take 1 capsule (300 mg total) by mouth at bedtime. (Patient not taking: Reported on 11/20/2019)   . latanoprost (XALATAN) 0.005 % ophthalmic solution Place 1 drop into both eyes at bedtime.   Marland Kitchen levothyroxine (SYNTHROID) 175 MCG tablet Take 1 tablet (175 mcg total) by mouth daily before breakfast.   . lisinopril (ZESTRIL) 20 MG  tablet Take 1 tablet (20 mg total) by mouth daily.   . metFORMIN (GLUCOPHAGE) 1000 MG tablet Take 1 tablet (1,000 mg total) by mouth 2 (two) times daily.   . mometasone-formoterol (DULERA) 200-5 MCG/ACT AERO Inhale 2 puffs into the lungs 2 (two) times daily. (Patient not taking: Reported on 11/20/2019)    No facility-administered encounter medications on file as of 12/29/2019.     Objective:   Goals Addressed            This Visit's Progress     Patient Stated   . PharmD "I can't afford my inhalers" (pt-stated)       Current Barriers:  . Financial Barriers related to COPD; unable to afford Advair. Collaboratively decided to apply for [employer] patient assistance for [employer], as patient's out of pocket spend did not meet criteria for [employer] assistance o Lexicographer on 10/20/2019 o [employer] mailed attestation to patient on 11/03/2019 o Patient reports that he mailed attestation letter back to [employer] "about 2-3 weeks ago" . Patient confirms that he recently moved to [place], [place]. He dropped off paperwork to establish with a new primary care provider yesterday, but may take a bit of time to hear back.  o Confirms he currently has albuterol rescue inhalers, and has been using Primatene mist (OTC inhaled epinephrine) PRN.   Pharmacist Clinical Goal(s):  Marland Kitchen Over the next 60 days, patient will work with PharmD and providers to relieve medication access concerns  Interventions: . Contacted [employer]. They note that they received his attestation letter back, but did not receive the  completed application back that was also enclosed. They mailed the attestation form with this need back out to him on 12/18/19.  . Called patient back with this information, left in a message. I encouraged to call back if he has future needs from [place] prior to establishing with a new provider in [place].   Patient Self Care Activities:  . Patient will provide necessary portions of application   Please see past  updates related to this goal by clicking on the "Past Updates" button in the selected goal          Plan:  - Will close case, as patient is establishing with a new PCP.   Catie Darnelle Maffucci, PharmD, [place] [phone]

## 2020-02-22 ENCOUNTER — Other Ambulatory Visit: Payer: Self-pay | Admitting: Nurse Practitioner

## 2020-02-22 NOTE — Telephone Encounter (Signed)
Requested medication (s) are due for refill today: Yes  Requested medication (s) are on the active medication list: Yes  Last refill:  10/02/19  Future visit scheduled: No  Notes to clinic:  See request.    Requested Prescriptions  Pending Prescriptions Disp Refills   metFORMIN (GLUCOPHAGE) 1000 MG tablet [Pharmacy Med Name: MetFORMIN 1000MG TABLET] 180 tablet 3    Sig: TAKE 1 TABLET BY MOUTH  TWICE DAILY      Endocrinology:  Diabetes - Biguanides Failed - 02/22/2020  6:14 AM      Failed - HBA1C is between 0 and 7.9 and within 180 days    Hgb A1c MFr Bld  Date Value Ref Range Status  10/02/2019 9.6 (H) 4.8 - 5.6 % Final    Comment:             Prediabetes: 5.7 - 6.4          Diabetes: >6.4          Glycemic control for adults with diabetes: <7.0           Passed - Cr in normal range and within 360 days    Creatinine, Ser  Date Value Ref Range Status  10/02/2019 0.96 0.76 - 1.27 mg/dL Final          Passed - eGFR in normal range and within 360 days    GFR calc Af Amer  Date Value Ref Range Status  10/02/2019 103 >59 mL/min/1.73 Final   GFR calc non Af Amer  Date Value Ref Range Status  10/02/2019 89 >59 mL/min/1.73 Final          Passed - Valid encounter within last 6 months    Recent Outpatient Visits           3 months ago Type 2 diabetes mellitus without complication, without long-term current use of insulin ([place])   [place], Jolene T, NP   4 months ago Type 2 diabetes mellitus without complication, without long-term current use of insulin ([place])   [place], Barbaraann Faster, NP

## 2020-05-12 ENCOUNTER — Encounter: Payer: Self-pay | Admitting: Nurse Practitioner

## 2020-07-01 ENCOUNTER — Telehealth: Payer: Self-pay | Admitting: Nurse Practitioner

## 2020-07-01 NOTE — Telephone Encounter (Signed)
Patient declined the Medicare Wellness Visit with [facility]  Patient has moved to [place]

## 2020-10-05 IMAGING — US ULTRASOUND ABDOMEN LIMITED
1 series · 14 of 25 positions shown · non-contrast
Comparison: Ultrasound 02/21/2019 and MRCP 02/21/2019.

CLINICAL DATA: Transaminitis.  Recent cholecystectomy 02/21/2019.

EXAM:
ULTRASOUND ABDOMEN LIMITED RIGHT UPPER QUADRANT

[Series 1: ultrasound abdomen limited · 0.31mm/px · 14 of 34 slices shown]
[im 1/34]
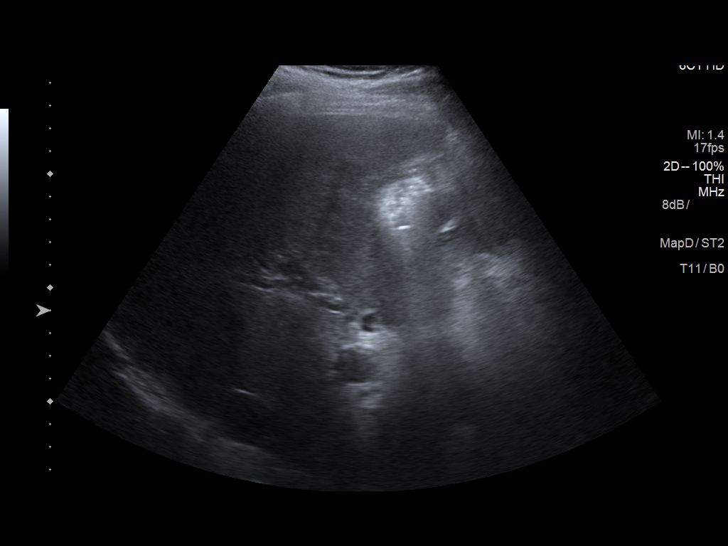
[im 3/34]
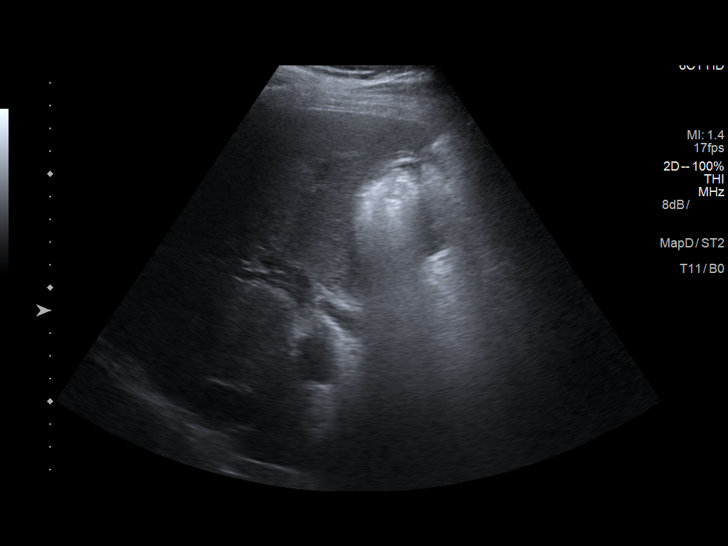
[im 6/34]
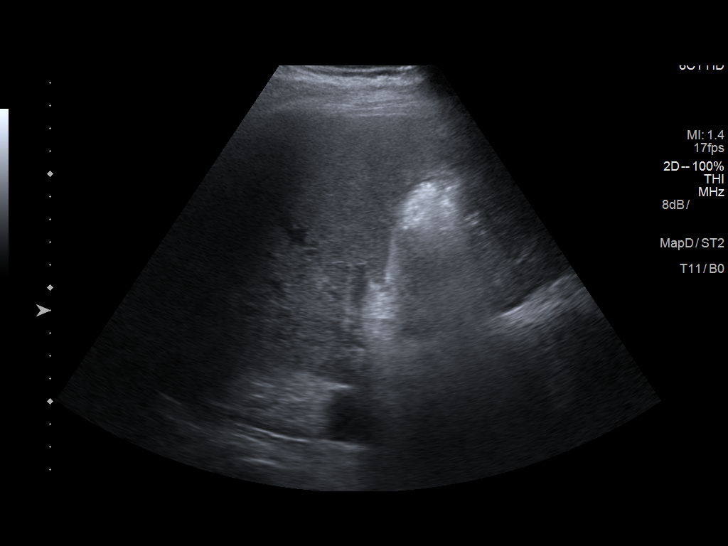
[im 9/34]
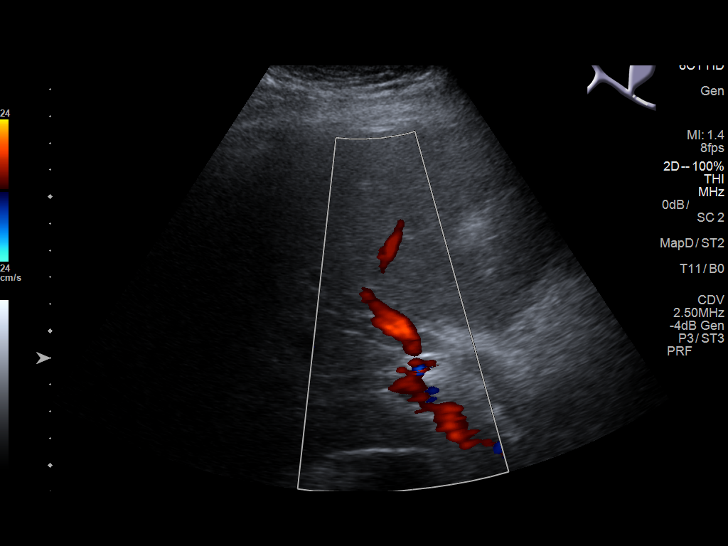
[im 12/34]
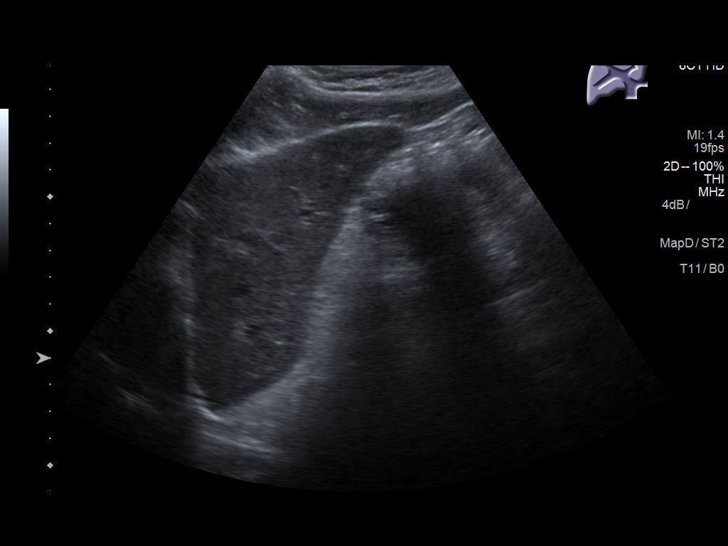
[im 13/34]
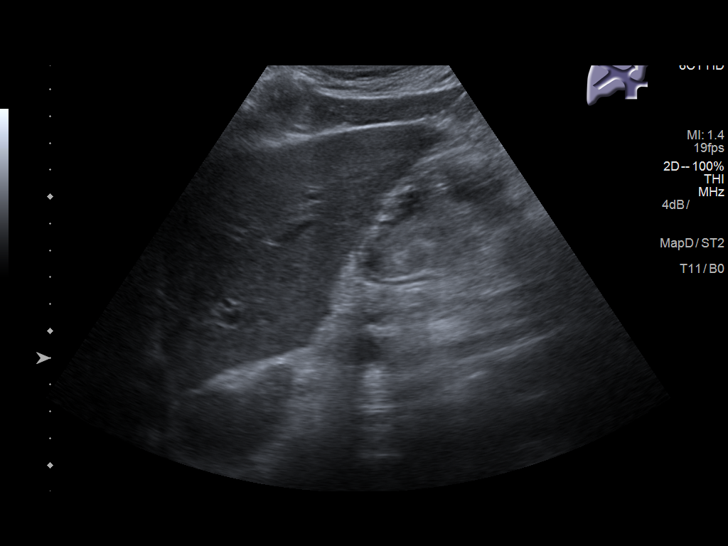
[im 16/34]
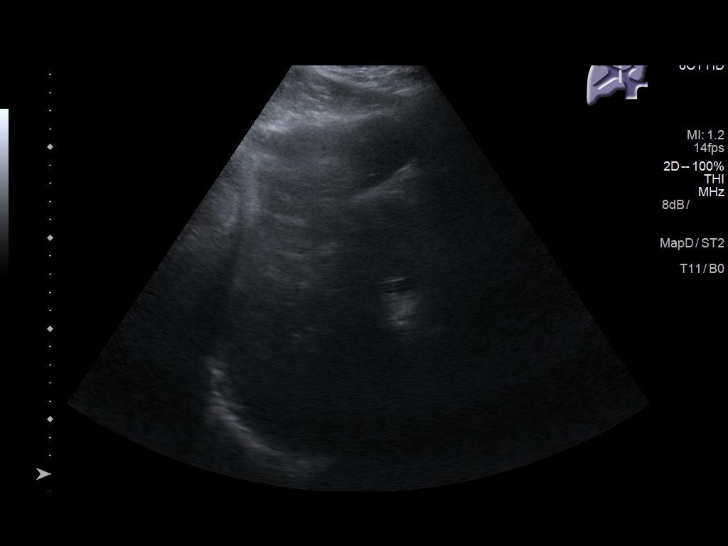
[im 18/34]
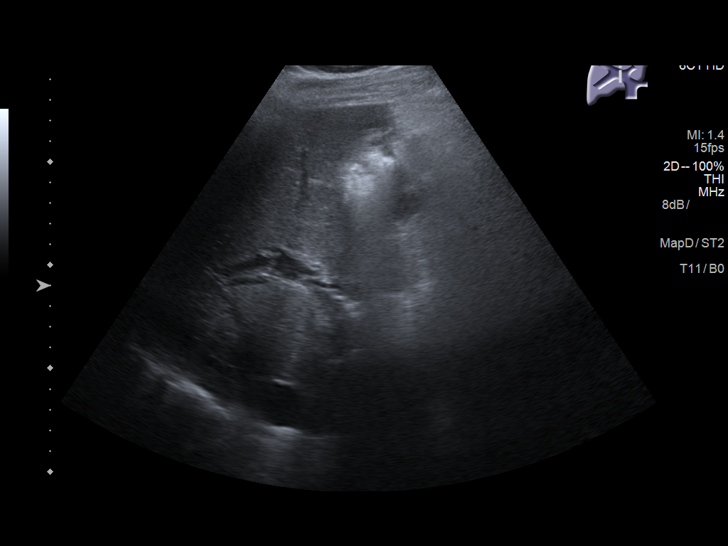
[im 21/34]
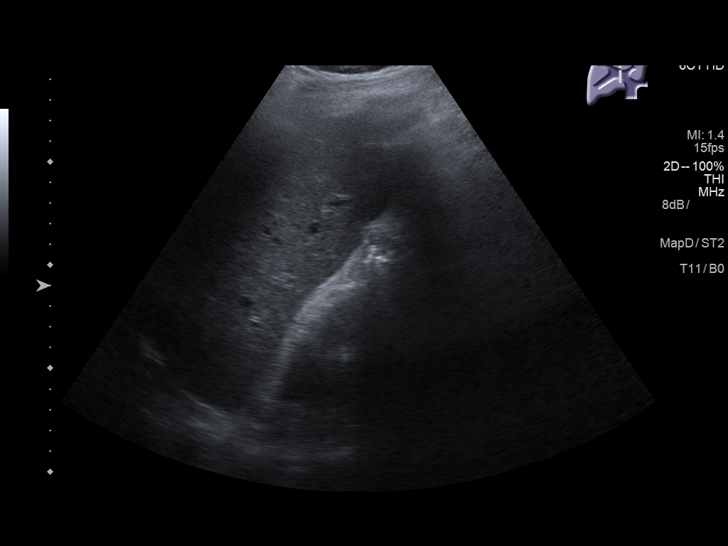
[im 23/34]
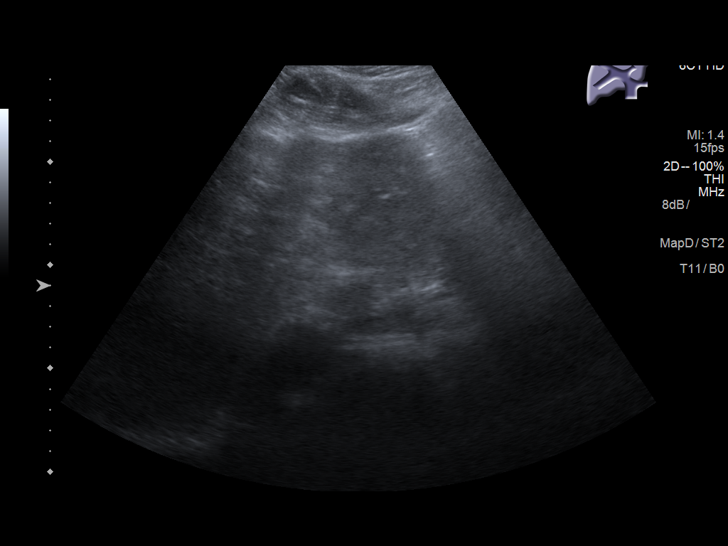
[im 25/34]
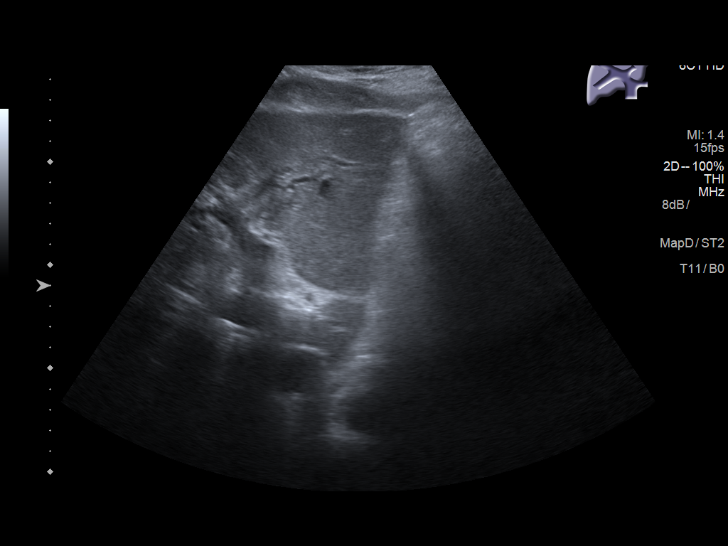
[im 28/34]
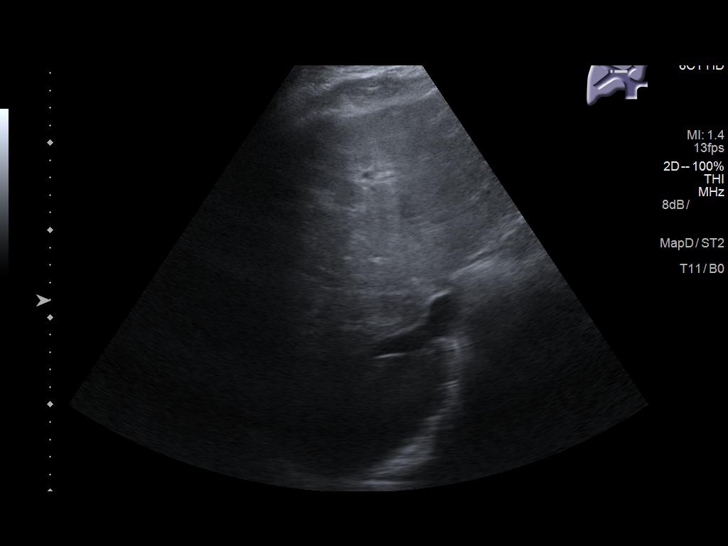
[im 31/34]
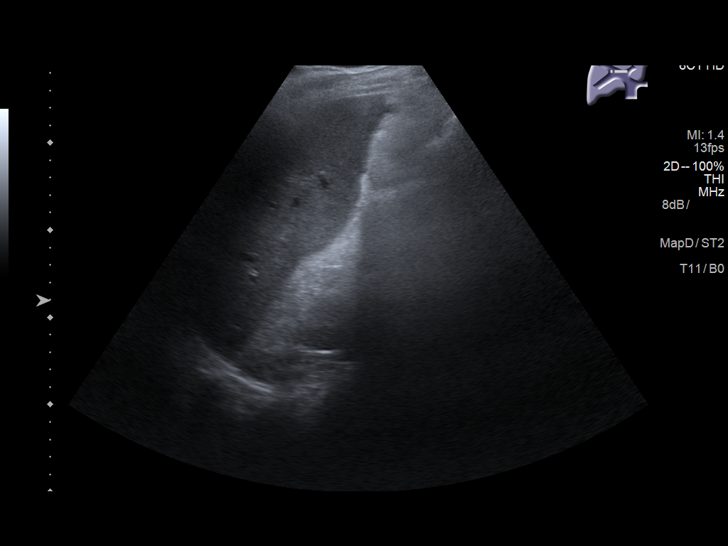
[im 34/34]
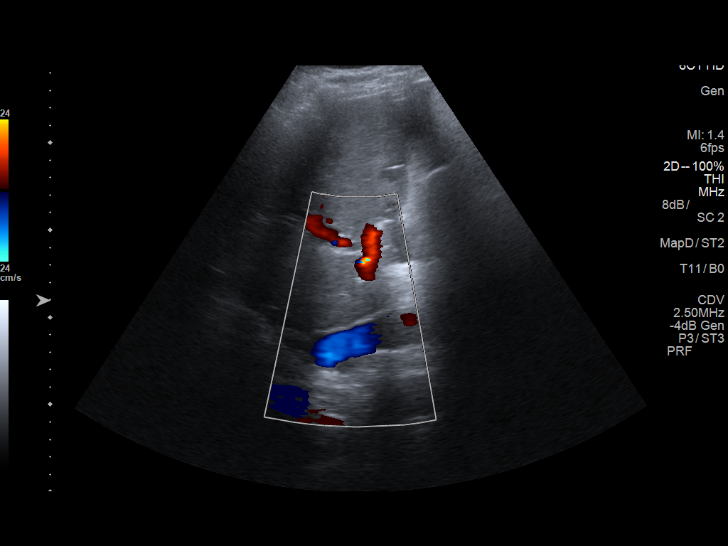

[14 of 25 positions shown; findings below may reference images not displayed]

FINDINGS: Gallbladder:

Recent cholecystectomy.

Common bile duct:

Diameter: 1 cm.

Liver:

No focal lesion identified. Within normal limits in parenchymal
echogenicity. Suggestion of minimal central intrahepatic duct
dilatation likely related to recent cholecystectomy. Portal vein is
patent on color Doppler imaging with normal direction of blood flow
towards the liver.
IMPRESSION: Evidence of recent cholecystectomy.  No acute findings.
